# Patient Record
Sex: Male | Born: 1955 | Race: White | Hispanic: No | Marital: Single | Smoking: Current every day smoker
Health system: Southern US, Community
[De-identification: ages and names within clinical notes are randomized; demographics above are authoritative.]

## PROBLEM LIST (undated history)

## (undated) DIAGNOSIS — E119 Type 2 diabetes mellitus without complications: Secondary | ICD-10-CM

## (undated) DIAGNOSIS — I10 Essential (primary) hypertension: Secondary | ICD-10-CM

---

## 2002-10-10 ENCOUNTER — Encounter: Payer: Self-pay | Admitting: Emergency Medicine

## 2002-10-10 ENCOUNTER — Emergency Department (HOSPITAL_COMMUNITY): Admission: EM | Admit: 2002-10-10 | Discharge: 2002-10-10 | Payer: Self-pay | Admitting: Emergency Medicine

## 2002-10-13 ENCOUNTER — Encounter (HOSPITAL_COMMUNITY): Admission: RE | Admit: 2002-10-13 | Discharge: 2003-01-11 | Payer: Self-pay | Admitting: Emergency Medicine

## 2003-10-11 ENCOUNTER — Emergency Department (HOSPITAL_COMMUNITY): Admission: EM | Admit: 2003-10-11 | Discharge: 2003-10-11 | Payer: Self-pay | Admitting: Emergency Medicine

## 2004-01-08 ENCOUNTER — Emergency Department (HOSPITAL_COMMUNITY): Admission: EM | Admit: 2004-01-08 | Discharge: 2004-01-08 | Payer: Self-pay | Admitting: Emergency Medicine

## 2004-05-07 ENCOUNTER — Emergency Department (HOSPITAL_COMMUNITY): Admission: EM | Admit: 2004-05-07 | Discharge: 2004-05-07 | Payer: Self-pay | Admitting: Family Medicine

## 2004-05-08 ENCOUNTER — Emergency Department (HOSPITAL_COMMUNITY): Admission: EM | Admit: 2004-05-08 | Discharge: 2004-05-08 | Payer: Self-pay | Admitting: Emergency Medicine

## 2004-05-09 ENCOUNTER — Emergency Department (HOSPITAL_COMMUNITY): Admission: EM | Admit: 2004-05-09 | Discharge: 2004-05-09 | Payer: Self-pay | Admitting: Emergency Medicine

## 2004-05-11 ENCOUNTER — Emergency Department (HOSPITAL_COMMUNITY): Admission: EM | Admit: 2004-05-11 | Discharge: 2004-05-11 | Payer: Self-pay | Admitting: *Deleted

## 2004-08-31 ENCOUNTER — Emergency Department (HOSPITAL_COMMUNITY): Admission: EM | Admit: 2004-08-31 | Discharge: 2004-08-31 | Payer: Self-pay | Admitting: Family Medicine

## 2005-03-27 ENCOUNTER — Emergency Department (HOSPITAL_COMMUNITY): Admission: EM | Admit: 2005-03-27 | Discharge: 2005-03-27 | Payer: Self-pay | Admitting: Family Medicine

## 2005-03-30 ENCOUNTER — Emergency Department (HOSPITAL_COMMUNITY): Admission: EM | Admit: 2005-03-30 | Discharge: 2005-03-30 | Payer: Self-pay | Admitting: Emergency Medicine

## 2005-04-02 ENCOUNTER — Emergency Department (HOSPITAL_COMMUNITY): Admission: EM | Admit: 2005-04-02 | Discharge: 2005-04-02 | Payer: Self-pay | Admitting: Emergency Medicine

## 2005-06-21 ENCOUNTER — Emergency Department (HOSPITAL_COMMUNITY): Admission: EM | Admit: 2005-06-21 | Discharge: 2005-06-21 | Payer: Self-pay | Admitting: Family Medicine

## 2006-05-23 ENCOUNTER — Emergency Department (HOSPITAL_COMMUNITY): Admission: EM | Admit: 2006-05-23 | Discharge: 2006-05-23 | Payer: Self-pay | Admitting: Emergency Medicine

## 2006-06-22 ENCOUNTER — Emergency Department (HOSPITAL_COMMUNITY): Admission: EM | Admit: 2006-06-22 | Discharge: 2006-06-22 | Payer: Self-pay | Admitting: Emergency Medicine

## 2007-04-02 ENCOUNTER — Emergency Department (HOSPITAL_COMMUNITY): Admission: EM | Admit: 2007-04-02 | Discharge: 2007-04-02 | Payer: Self-pay | Admitting: Family Medicine

## 2007-04-06 ENCOUNTER — Emergency Department (HOSPITAL_COMMUNITY): Admission: EM | Admit: 2007-04-06 | Discharge: 2007-04-06 | Payer: Self-pay | Admitting: Family Medicine
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
## 2007-10-30 ENCOUNTER — Emergency Department (HOSPITAL_COMMUNITY): Admission: EM | Admit: 2007-10-30 | Discharge: 2007-10-30 | Payer: Self-pay | Admitting: Family Medicine

## 2010-10-11 ENCOUNTER — Encounter: Payer: Self-pay | Admitting: *Deleted
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
## 2018-05-30 ENCOUNTER — Inpatient Hospital Stay (HOSPITAL_COMMUNITY)
Admission: AD | Admit: 2018-05-30 | Discharge: 2018-06-06 | DRG: 885 | Disposition: A | Discharge status: Home / Self Care | Payer: Federal, State, Local not specified - Other | Source: Intra-hospital | Attending: Psychiatry | Admitting: Psychiatry

## 2018-05-30 DIAGNOSIS — Z23 Encounter for immunization: Secondary | ICD-10-CM | POA: Diagnosis not present

## 2018-05-30 DIAGNOSIS — F10239 Alcohol dependence with withdrawal, unspecified: Secondary | ICD-10-CM | POA: Diagnosis present

## 2018-05-30 DIAGNOSIS — F332 Major depressive disorder, recurrent severe without psychotic features: Principal | ICD-10-CM | POA: Diagnosis present

## 2018-05-30 DIAGNOSIS — F1020 Alcohol dependence, uncomplicated: Secondary | ICD-10-CM | POA: Diagnosis present

## 2018-05-30 DIAGNOSIS — R45851 Suicidal ideations: Secondary | ICD-10-CM | POA: Diagnosis present

## 2018-05-30 DIAGNOSIS — E119 Type 2 diabetes mellitus without complications: Secondary | ICD-10-CM | POA: Diagnosis present

## 2018-05-30 DIAGNOSIS — I10 Essential (primary) hypertension: Secondary | ICD-10-CM | POA: Diagnosis present

## 2018-05-30 DIAGNOSIS — Z9114 Patient's other noncompliance with medication regimen: Secondary | ICD-10-CM

## 2018-05-30 DIAGNOSIS — F17210 Nicotine dependence, cigarettes, uncomplicated: Secondary | ICD-10-CM | POA: Diagnosis present

## 2018-05-30 DIAGNOSIS — F419 Anxiety disorder, unspecified: Secondary | ICD-10-CM | POA: Diagnosis present

## 2018-05-30 DIAGNOSIS — R45850 Homicidal ideations: Secondary | ICD-10-CM | POA: Diagnosis present

## 2018-05-30 DIAGNOSIS — W1830XA Fall on same level, unspecified, initial encounter: Secondary | ICD-10-CM | POA: Diagnosis not present

## 2018-05-30 DIAGNOSIS — Z7984 Long term (current) use of oral hypoglycemic drugs: Secondary | ICD-10-CM | POA: Diagnosis not present

## 2018-05-30 DIAGNOSIS — G4700 Insomnia, unspecified: Secondary | ICD-10-CM | POA: Diagnosis present

## 2018-05-30 DIAGNOSIS — Y92238 Other place in hospital as the place of occurrence of the external cause: Secondary | ICD-10-CM | POA: Diagnosis not present

## 2018-05-30 DIAGNOSIS — Z818 Family history of other mental and behavioral disorders: Secondary | ICD-10-CM

## 2018-05-30 HISTORY — DX: Essential (primary) hypertension: I10

## 2018-05-30 HISTORY — DX: Type 2 diabetes mellitus without complications: E11.9

## 2018-05-30 MED ORDER — MAGNESIUM HYDROXIDE 400 MG/5ML PO SUSP: 30.0000 mL | Freq: Every day | ORAL | Status: DC | PRN

## 2018-05-30 MED ORDER — HYDROXYZINE HCL 25 MG PO TABS
25.0000 mg | ORAL_TABLET | Freq: Three times a day (TID) | ORAL | Status: DC | PRN
  Administered 2018-05-30: 25 mg via ORAL
  Filled 2018-05-30: qty 1

## 2018-05-30 MED ORDER — ACETAMINOPHEN 325 MG PO TABS: 650.0000 mg | ORAL_TABLET | Freq: Four times a day (QID) | ORAL | Status: DC | PRN

## 2018-05-30 MED ORDER — TRAZODONE HCL 50 MG PO TABS
50.0000 mg | ORAL_TABLET | Freq: Every evening | ORAL | Status: DC | PRN
  Administered 2018-05-30: 50 mg via ORAL
  Filled 2018-05-30: qty 1

## 2018-05-30 MED ORDER — ALUM & MAG HYDROXIDE-SIMETH 200-200-20 MG/5ML PO SUSP: 30.0000 mL | ORAL | Status: DC | PRN

## 2018-05-30 NOTE — BH Assessment (Signed)
Assessment Note  Fernando Wilkinson is an 62 y.o. male. Patient states that he is depressed for the past month with increased anxiety and feeling fearful.  He states, "I just cannot stand it anymore."  Patient states, "I don't have that much going on, but I am having very negative thoughts and I have been feeling suicidal."  Patient states that he has been worrying excessively about taxes that he owes, but strates, "I should not be that worried because they have not come after me yet, but it goes round and round in my mind and I cannot let it go.  Patient states that  one day when he was sitting atound his house, he noticed a rope and had thoughts about hanging himself.  Patient states that he has been drinking a pint of vodka 3-4 times a week in order to self-meicate his depression.  Patient states that he started drinking at the age of 85 and states that he was a problematic drinker in the past, but went to treatment at  [facility] and [facility] after getting a DWI and was able to stay sober for six years.  Patient states that his drinking has cost him another DWI.  He states that he missed his first court date, but states that it is back in the [place] for this Thursday.  Patient states that he has an attorney, Harrie Jeans who can get his case continued.  Patient states that he has no prior suicide attempts and states that he has never been homicidal.  He denied experiencing psychosis, but he had some thought blocking and he would look to his left everything prior to answering questions. Patient states that he has an uneasy felling like something is getting ready to happen.  He states that he has been on Zoloft in the past prescribed by his PCP, but states that he has not taken this medication in several years.  Patient states that he has not been eating well recently due to no appetite and states that he has most likely lost 5-10 pounds.  Patient had a very depressed mood and flat affect.  His memory  was intact, but his thoughts were scattered.  He had a very negative outlook on life and was very pessimistic.  His judgment is impaired, he has fair isight, but his impulse control is poor.  He looked like he might be responding to internal stimuli. Patient stated that he was very anxious.  Patient states that he lives alone, he has never been married and he has no children.  He works as a Curator.  He has a pending court date scheduled in court on 06/01/18.  Patient has questional ability to contract for safety at home and could benefit from behavioral health services.  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    Family History: No family history on file.  Social History:  has no tobacco, alcohol, and drug history on file.  Additional Social History:  Alcohol / Drug Use Pain Medications: See MAR Prescriptions: See MAR Over the Counter: See MAR History of alcohol / drug use?: Yes Longest period of sobriety (when/how long): 6 years Negative Consequences of Use: Legal Substance #1 Name of Substance 1: alcohol 1 - Age of First Use: 19 1 - Amount (size/oz): pint of liquor 1 - Frequency: 3-4 nights a week 1 - Duration: unknown 1 - Last Use /  Amount: 05/29/2018  CIWA:   COWS:    Allergies: Allergies not on file  Home Medications:  No medications prior to admission.    OB/GYN Status:  No LMP for male patient.  General Assessment Data Location of Assessment: [facility] TTS Assessment: Out of system Is this a Tele or Face-to-Face Assessment?: Tele Assessment Is this an Initial Assessment or a Re-assessment for this encounter?: Initial Assessment Patient Accompanied by:: N/A Language Other than English: No Living Arrangements: Other (Comment)(home) What gender do you identify as?: Male Marital status: Single Maiden name: NA Pregnancy  Status: Other (Comment)(male) Living Arrangements: Alone Can pt return to current living arrangement?: Yes Admission Status: Voluntary Is patient capable of signing voluntary admission?: Yes Referral Source: Self/Family/Friend Insurance type: Self Pay     Crisis Care Plan Living Arrangements: Alone Legal Guardian: Other:(self) Name of Psychiatrist: none Name of Therapist: none  Education Status Is patient currently in school?: No Is the patient employed, unemployed or receiving disability?: Employed  Risk to self with the past 6 months Suicidal Ideation: Yes-Currently Present Has patient been a risk to self within the past 6 months prior to admission? : Yes Suicidal Intent: Yes-Currently Present Has patient had any suicidal intent within the past 6 months prior to admission? : Yes Is patient at risk for suicide?: Yes Suicidal Plan?: Yes-Currently Present Has patient had any suicidal plan within the past 6 months prior to admission? : Yes Specify Current Suicidal Plan: hang himself Access to Means: Yes Specify Access to Suicidal Means: has rope What has been your use of drugs/alcohol within the last 12 months?: drinking a pint of alcohol every few days Previous Attempts/Gestures: No How many times?: 0 Other Self Harm Risks: none Triggers for Past Attempts: Unknown Intentional Self Injurious Behavior: None Family Suicide History: Unknown Recent stressful life event(s): Legal Issues Persecutory voices/beliefs?: No Depression: Yes Depression Symptoms: Insomnia, Tearfulness, Isolating, Despondent Substance abuse history and/or treatment for substance abuse?: Yes Suicide prevention information given to non-admitted patients: Not applicable  Risk to Others within the past 6 months Homicidal Ideation: No Does patient have any lifetime risk of violence toward others beyond the six months prior to admission? : No Thoughts of Harm to Others: No Current Homicidal Intent:  No Current Homicidal Plan: No Access to Homicidal Means: No Identified Victim: none History of harm to others?: No Assessment of Violence: None Noted Violent Behavior Description: none Does patient have access to weapons?: No Criminal Charges Pending?: No Does patient have a court date: Yes Court Date: 06/01/18 Is patient on probation?: No  Psychosis Hallucinations: None noted Delusions: None noted  Mental Status Report Appearance/Hygiene: Unremarkable Eye Contact: Good Motor Activity: Unremarkable Speech: Logical/coherent Level of Consciousness: Alert Mood: Depressed Affect: Depressed Anxiety Level: None Thought Processes: Coherent, Relevant Judgement: Impaired Orientation: Person, Place, Time, Situation Obsessive Compulsive Thoughts/Behaviors: None  Cognitive Functioning Concentration: Normal Memory: Recent Intact, Remote Intact Is patient IDD: No Insight: Poor Impulse Control: Poor Appetite: Poor Have you had any weight changes? : Loss Amount of the weight change? (lbs): 10 lbs Sleep: No Change Total Hours of Sleep: 8 Vegetative Symptoms: None  ADLScreening [facility] Assessment Services) Patient's cognitive ability adequate to safely complete daily activities?: Yes Patient able to express need for assistance with ADLs?: Yes Independently performs ADLs?: Yes (appropriate for developmental age)  Prior Inpatient Therapy Prior Inpatient Therapy: Yes Prior Therapy Dates: 2013 Prior [facility]ilty[facility] Reason for Treatment: SA  Prior Outpatient Therapy Prior Outpatient Therapy: No  Does patient have an ACCT team?: No Does patient have Intensive In-House Services?  : No Does patient have [facility] services? : No Does patient have P4CC services?: No  ADL Screening (condition at time of admission) Patient's cognitive ability adequate to safely complete daily activities?: Yes Patient able to express need for assistance with ADLs?:  Yes Independently performs ADLs?: Yes (appropriate for developmental age)       Abuse/Neglect Assessment (Assessment to be complete while patient is alone) Abuse/Neglect Assessment Can Be Completed: Yes Physical Abuse: Denies Verbal Abuse: Denies Sexual Abuse: Denies Exploitation of patient/patient's resources: Denies Self-Neglect: Denies Values / Beliefs Cultural Requests During Hospitalization: None Spiritual Requests During Hospitalization: None Consults Spiritual Care Consult Needed: No Social Work Consult Needed: No            Disposition:  Disposition Initial Assessment Completed for this Encounter: Yes Disposition of Patient: Admit Type of inpatient treatment program: Adult Patient refused recommended treatment: No Mode of transportation if patient is discharged?: N/A  On Site Evaluation by:   Reviewed with Physician:    Ottis Stain 05/30/2018 8:43 PM

## 2018-05-31 ENCOUNTER — Other Ambulatory Visit: Payer: Self-pay

## 2018-05-31 ENCOUNTER — Encounter (HOSPITAL_COMMUNITY): Payer: Self-pay | Admitting: *Deleted

## 2018-05-31 DIAGNOSIS — F1020 Alcohol dependence, uncomplicated: Secondary | ICD-10-CM | POA: Diagnosis present

## 2018-05-31 DIAGNOSIS — F332 Major depressive disorder, recurrent severe without psychotic features: Principal | ICD-10-CM

## 2018-05-31 LAB — LIPID PANEL
CHOL/HDL RATIO: 4.8 ratio
CHOLESTEROL: 197 mg/dL (ref 0–200)
HDL: 41 mg/dL (ref >40)
LDL Cholesterol: 115 mg/dL — ABNORMAL HIGH (ref 0–99)
Triglycerides: 203 mg/dL — ABNORMAL HIGH (ref <150)
VLDL: 41 mg/dL — ABNORMAL HIGH (ref 0–40)

## 2018-05-31 LAB — TSH: TSH: 1.978 u[IU]/mL (ref 0.350–4.500)

## 2018-05-31 MED ORDER — METFORMIN HCL ER 500 MG PO TB24
500.0000 mg | ORAL_TABLET | Freq: Every day | ORAL | Status: DC
  Administered 2018-05-31 – 2018-06-06 (×7): 500 mg via ORAL
  Filled 2018-05-31 (×2): qty 1
  Filled 2018-05-31: qty 7
  Filled 2018-05-31 (×6): qty 1

## 2018-05-31 MED ORDER — PNEUMOCOCCAL VAC POLYVALENT 25 MCG/0.5ML IJ INJ
0.5000 mL | INJECTION | INTRAMUSCULAR | Status: AC
Start: 2018-06-01 — End: 2018-06-02
  Administered 2018-06-02: 0.5 mL via INTRAMUSCULAR

## 2018-05-31 MED ORDER — FLUOXETINE HCL 20 MG PO CAPS
20.0000 mg | ORAL_CAPSULE | Freq: Every day | ORAL | Status: DC
  Administered 2018-05-31 – 2018-06-06 (×7): 20 mg via ORAL
  Filled 2018-05-31 (×3): qty 1
  Filled 2018-05-31: qty 7
  Filled 2018-05-31 (×6): qty 1

## 2018-05-31 MED ORDER — LOSARTAN POTASSIUM 50 MG PO TABS
100.0000 mg | ORAL_TABLET | Freq: Every day | ORAL | Status: DC
  Administered 2018-05-31 – 2018-06-06 (×7): 100 mg via ORAL
  Filled 2018-05-31 (×4): qty 2
  Filled 2018-05-31: qty 14
  Filled 2018-05-31 (×4): qty 2

## 2018-05-31 MED ORDER — INFLUENZA VAC SPLIT QUAD 0.5 ML IM SUSY
0.5000 mL | PREFILLED_SYRINGE | INTRAMUSCULAR | Status: AC
  Administered 2018-06-02: 0.5 mL via INTRAMUSCULAR
  Filled 2018-05-31: qty 0.5

## 2018-05-31 MED ORDER — TRAZODONE HCL 50 MG PO TABS
50.0000 mg | ORAL_TABLET | Freq: Every evening | ORAL | Status: DC | PRN
  Administered 2018-05-31 – 2018-06-05 (×6): 50 mg via ORAL
  Filled 2018-05-31 (×6): qty 1
  Filled 2018-05-31: qty 14

## 2018-05-31 MED ORDER — ARIPIPRAZOLE 5 MG PO TABS
5.0000 mg | ORAL_TABLET | Freq: Every day | ORAL | Status: DC
  Administered 2018-05-31 – 2018-06-06 (×7): 5 mg via ORAL
  Filled 2018-05-31 (×3): qty 1
  Filled 2018-05-31: qty 7
  Filled 2018-05-31 (×7): qty 1

## 2018-05-31 MED ORDER — NICOTINE 21 MG/24HR TD PT24
21.0000 mg | MEDICATED_PATCH | Freq: Every day | TRANSDERMAL | Status: DC
  Administered 2018-05-31 – 2018-06-06 (×7): 21 mg via TRANSDERMAL
  Filled 2018-05-31 (×10): qty 1

## 2018-05-31 MED ORDER — HYDROXYZINE HCL 50 MG PO TABS
50.0000 mg | ORAL_TABLET | Freq: Four times a day (QID) | ORAL | Status: DC | PRN
  Administered 2018-05-31 – 2018-06-05 (×12): 50 mg via ORAL
  Filled 2018-05-31: qty 1
  Filled 2018-05-31: qty 10
  Filled 2018-05-31 (×12): qty 1

## 2018-05-31 MED ORDER — GEMFIBROZIL 600 MG PO TABS
600.0000 mg | ORAL_TABLET | Freq: Two times a day (BID) | ORAL | Status: DC
  Administered 2018-05-31 – 2018-06-06 (×12): 600 mg via ORAL
  Filled 2018-05-31 (×2): qty 1
  Filled 2018-05-31: qty 14
  Filled 2018-05-31 (×5): qty 1
  Filled 2018-05-31: qty 14
  Filled 2018-05-31 (×7): qty 1

## 2018-05-31 NOTE — Plan of Care (Signed)
  Problem: Activity: Goal: Interest or engagement in activities will improve Outcome: Progressing   Problem: Coping: Goal: Ability to verbalize frustrations and anger appropriately will improve Outcome: Progressing   Problem: Safety: Goal: Periods of time without injury will increase Outcome: Progressing   
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
## 2018-05-31 NOTE — Progress Notes (Signed)
Per pt request, CSW contacted his attorney's office Harrie Jeans) [phone] to inform him of pt's hospitalization. Secretary requested hospital note be faxed to [phone] indicating that pt will not be discharged in time for 9/12 court date. Hospital note faxed and confirmation fax provided to pt for his records.  Jasim Harari S. Alan Ripper, MSW, LCSW Clinical Social Worker 05/31/2018 2:15 PM

## 2018-05-31 NOTE — Progress Notes (Signed)
DAR NOTE: Patient presents with anxious affect and flat mood.  Pt has been visible in the milieu interacting with peers in the dayroom. Pt has been a little bit restless and worried about his medications most of the day. Pt stated he does not know what is going on and feeling a bit confused. Pt report fair night sleep, good appetite, low energy and poor concentration. Denies pain, auditory and visual hallucinations.  Rates depression at 5, hopelessness at 5, and anxiety at 3.  Maintained on routine safety checks.  Medications given as prescribed.  Support and encouragement offered as needed.  Attended group and participated.  States goal for today is " to get through the day."  Patient observed socializing with peers in the dayroom.  Offered no complaint.

## 2018-05-31 NOTE — H&P (Addendum)
Psychiatric Admission Assessment Adult  Patient Identification: Fernando Wilkinson  MRN:  [id number]  Date of Evaluation:  05/31/2018  Chief Complaint: Worsening symptoms of anxiety, suicidal ideations with plans to hang himself, increased alcohol use".   Principal Diagnosis: Severe recurrent major depression without psychotic features ([place])  Diagnosis:   Patient Active Problem List   Diagnosis Date Noted  . Severe recurrent major depression without psychotic features ([place]) [F33.2] 05/30/2018    Priority: High   History of Present Illness:This is an admission assessment for  this 62 year old Caucasian male with hx of depression, anxiety & alcohol use disorders. He is admitted to the [facility] from the [facility] with complaints of worsening symptoms of depression, suicidal ideations with plans to hang himself, increased alcohol consumption & lack of support system. He does have previous hx of mental illness & have had inpatient psychiatric hospitalizations 20 years. He is admitted for mood stabilization treatment as he reported his Celexa had stopped working.  During this assessment, Fernando Wilkinson reports, "I went to the [facility] yesterday morning. I felt like I needed some kind of help. My nerves are on the edge. I am feeling afraid & anxious all the time. I have been in this kind of situation in 1997, or was it 1998, oh, it was in the 1990s. At the time, I was hospitalized at [facility]. I was depressed & anxious just like I am now. The suicidal thoughts started 3 weeks ago. I thought about hanging myself after I saw a rope on my porch. I have never attempted suicide, but my maternal cousin did. He tried to drown himself, but he did not succeed. I have been on medication for depression. The most recent was (Citalopram). I took it last about 6 months ago. I did not think it was working. I have been drinking a lot to deal with this anxiety. A pint of liquor a day x 2 weeks. No withdrawal  symptoms".  Objective: Fernando Wilkinson presents as to having thought blocking episodes, weird, appears to be responding to some internal stimuli & overly distracted. Has some weird facial expressions going on".  Associated Signs/Symptoms:  Depression Symptoms:  depressed mood, insomnia, hopelessness, anxiety,  (Hypo) Manic Symptoms:  Impulsivity, Labiality of Mood,  Anxiety Symptoms:  Excessive Worry,  Psychotic Symptoms:  Although denies any hallucinations, patient appears to have thought blocking episodes & also responding to some internal stimuli. He seem very distracted.  PTSD Symptoms: Denies any PTSD symptoms or events.  Total Time spent with patient: 1 hour  Past Psychiatric History: Major depressive disorder.  Is the patient at risk to self? Yes.     Has the patient been a risk to self in the past 6 months? Yes.     Has the patient been a risk to self within the distant past? Yes.     Is the patient a risk to others? No.   Has the patient been a risk to others in the past 6 months? No.   Has the patient been a risk to others within the distant past? No.   Prior Inpatient Therapy: Prior Inpatient Therapy: Yes Prior Therapy Dates: 2013 Prior [facility] Facilty[facility]Provider[facility]): [facility] Reason for Treatment: SA  Prior Outpatient Therapy: Prior Outpatient Therapy: No Does patient have an ACCT team?: No Does patient have Intensive In-House Services?  : No Does patient have [facility] services? : No Does patient have P4CC services?: No  Alcohol Screening: 1. How often do you have a  drink containing alcohol?: 4 or more times a week 2. How many drinks containing alcohol do you have on a typical day when you are drinking?: 7, 8, or 9 3. How often do you have six or more drinks on one occasion?: Daily or almost daily AUDIT-C Score: 11 4. How often during the last year have you found that you were not able to stop drinking once you had started?: Weekly 5. How often  during the last year have you failed to do what was normally expected from you becasue of drinking?: Monthly 6. How often during the last year have you needed a first drink in the morning to get yourself going after a heavy drinking session?: Less than monthly 7. How often during the last year have you had a feeling of guilt of remorse after drinking?: Weekly 8. How often during the last year have you been unable to remember what happened the night before because you had been drinking?: Less than monthly 9. Have you or someone else been injured as a result of your drinking?: Yes, but not in the last year 10. Has a relative or friend or a doctor or another health worker been concerned about your drinking or suggested you cut down?: No Alcohol Use Disorder Identification Test Final Score (AUDIT): 23 Intervention/Follow-up: Alcohol Education  Substance Abuse History in the last 12 months:  Yes.    Consequences of Substance Abuse: Medical Consequences:  Liver damage, Possible death by overdose Legal Consequences:  Arrests, jail time, Loss of driving privilege. Family Consequences:  Family discord, divorce and or separation.  Previous Psychotropic Medications: Yes (Celexa)  Psychological Evaluations: Yes   Past Medical History:  Past Medical History:  Diagnosis Date  . Diabetes mellitus without complication ([place])   . Hypertension    History reviewed. No pertinent surgical history.  Family History: History reviewed. No pertinent family history.  Family Psychiatric  History: Major depression: Mother.                                                      Suicide attempt: Maternal cousin.  Tobacco Screening: Have you used any form of tobacco in the last 30 days? (Cigarettes, Smokeless Tobacco, Cigars, and/or Pipes): Yes Tobacco use, Select all that apply: 5 or more cigarettes per day Are you interested in Tobacco Cessation Medications?: Yes, will notify MD for an order Counseled patient on  smoking cessation including recognizing danger situations, developing coping skills and basic information about quitting provided: Refused/Declined practical counseling  Social History:  Social History   Substance and Sexual Activity  Alcohol Use Yes   Comment: daily use     Social History   Substance and Sexual Activity  Drug Use Not Currently    Additional Social History: Marital status: Single Are you sexually active?: No What is your sexual orientation?: heterosexual Has your sexual activity been affected by drugs, alcohol, medication, or emotional stress?: n/a  Does patient have children?: No Pain Medications: See MAR Prescriptions: See MAR Over the Counter: See MAR History of alcohol / drug use?: Yes Longest period of sobriety (when/how long): 6 years Negative Consequences of Use: Legal Name of Substance 1: alcohol 1 - Age of First Use: 19 1 - Amount (size/oz): pint of liquor 1 - Frequency: 3-4 nights a week 1 - Duration: unknown 1 -  Last Use / Amount: 05/29/2018  Allergies:   Allergies  Allergen Reactions  . Sulfa Antibiotics Itching   Lab Results:  Results for orders placed or performed during the hospital encounter of 05/30/18 (from the past 48 hour(s))  Lipid panel     Status: Abnormal   Collection Time: 05/31/18  6:42 AM  Result Value Ref Range   Cholesterol 197 0 - 200 mg/dL   Triglycerides 203 (H) <150 mg/dL   HDL 41 >40 mg/dL   Total CHOL/HDL Ratio 4.8 RATIO   VLDL 41 (H) 0 - 40 mg/dL   LDL Cholesterol 115 (H) 0 - 99 mg/dL    Comment:        Total Cholesterol/HDL:CHD Risk Coronary Heart Disease Risk Table                     Men   Women  1/2 Average Risk   3.4   3.3  Average Risk       5.0   4.4  2 X Average Risk   9.6   7.1  3 X Average Risk  23.4   11.0        Use the calculated Patient Ratio above and the CHD Risk Table to determine the patient's CHD Risk.        ATP III CLASSIFICATION (LDL):  <100     mg/dL   Optimal  100-129  mg/dL    Near or Above                    Optimal  130-159  mg/dL   Borderline  160-189  mg/dL   High  >190     mg/dL   Very High Performed at [facility] [address] [place], [place] [address]   TSH     Status: None   Collection Time: 05/31/18  6:42 AM  Result Value Ref Range   TSH 1.978 0.350 - 4.500 uIU/mL    Comment: Performed by a 3rd Generation assay with a functional sensitivity of <=0.01 uIU/mL. Performed at [facility], [place] [address], [place],  [address]    Blood Alcohol level:  No results found for: [facility]  Metabolic Disorder Labs:  No results found for: HGBA1C, MPG No results found for: PROLACTIN Lab Results  Component Value Date   CHOL 197 05/31/2018   TRIG 203 (H) 05/31/2018   HDL 41 05/31/2018   CHOLHDL 4.8 05/31/2018   VLDL 41 (H) 05/31/2018   LDLCALC 115 (H) 05/31/2018   Current Medications: Current Facility-Administered Medications  Medication Dose Route Frequency Provider Last Rate Last Dose  . acetaminophen (TYLENOL) tablet 650 mg  650 mg Oral Q6H PRN Rozetta Nunnery, NP      . alum & mag hydroxide-simeth (MAALOX/MYLANTA) 200-200-20 MG/5ML suspension 30 mL  30 mL Oral Q4H PRN Lindon Romp A, NP      . ARIPiprazole (ABILIFY) tablet 5 mg  5 mg Oral Daily Nwoko, Agnes I, NP      . FLUoxetine (PROZAC) capsule 20 mg  20 mg Oral Daily Nwoko, Agnes I, NP      . hydrOXYzine (ATARAX/VISTARIL) tablet 25 mg  25 mg Oral TID PRN Lindon Romp A, NP   25 mg at 05/30/18 2319  . [START ON 06/01/2018] Influenza vac split quadrivalent PF (FLUARIX) injection 0.5 mL  0.5 mL Intramuscular Tomorrow-1000 Cobos, Fernando A, MD      . magnesium hydroxide (MILK OF MAGNESIA) suspension 30  mL  30 mL Oral Daily PRN Lindon Romp A, NP      . nicotine (NICODERM CQ - dosed in mg/24 hours) patch 21 mg  21 mg Transdermal Daily Cobos, Myer Peer, MD   21 mg at 05/31/18 0813  . [START ON 06/01/2018] pneumococcal 23 valent vaccine (PNU-IMMUNE) injection 0.5  mL  0.5 mL Intramuscular Tomorrow-1000 Cobos, Fernando A, MD      . traZODone (DESYREL) tablet 50 mg  50 mg Oral QHS PRN Lindon Romp A, NP   50 mg at 05/30/18 2319   PTA Medications: No medications prior to admission.   Musculoskeletal: Strength & Muscle Tone: within normal limits Gait & Station: normal Patient leans: N/A  Psychiatric Specialty Exam: Physical Exam  Constitutional: He is oriented to person, place, and time. He appears well-developed and well-nourished.  HENT:  Head: Normocephalic.  Eyes: Pupils are equal, round, and reactive to light.  Neck: Normal range of motion.  Cardiovascular: Normal rate.  Respiratory: Effort normal.  GI: Soft.  Genitourinary:  Genitourinary Comments: Deferred  Musculoskeletal: Normal range of motion.  Neurological: He is alert and oriented to person, place, and time.  Skin: Skin is warm.    Review of Systems  Constitutional: Negative.   HENT: Negative.   Eyes: Negative.   Respiratory: Negative.   Cardiovascular: Negative.   Gastrointestinal: Negative.   Genitourinary: Negative.   Musculoskeletal: Negative.   Skin: Negative.   Neurological: Negative.   Endo/Heme/Allergies: Negative.   Psychiatric/Behavioral: Positive for depression and substance abuse. Negative for hallucinations and memory loss. The patient is nervous/anxious and has insomnia.     Blood pressure 113/76, pulse 77, temperature (!) 97.3 F (36.3 C), temperature source Oral, resp. rate 18, height 5' 11"  (1.803 m), weight 108.4 kg.Body mass index is 33.33 kg/m.  General Appearance: Disheveled  Eye Contact:  Fair  Speech:  Clear and Coherent and some thought blocking episodes noted.  Volume:  Normal  Mood:  Anxious and Depressed  Affect:  Depressed and Flat  Thought Process:  Disorganized and Descriptions of Associations: Tangential  Orientation:  Full (Time, Place, and Person)  Thought Content:  Ruminations, denies any hallucinations, delusions or paranoia.  (Patient does appear to have thought epiosdes & responding to any internal stimuli)  Suicidal Thoughts:  Yes.  without intent/plan  Homicidal Thoughts:  Denies.  Memory:  Immediate;   Fair Recent;   Fair Remote;   Fair  Judgement:  Fair  Insight:  Fair  Psychomotor Activity:  Normal  Concentration:  Concentration: Fair and Attention Span: Fair  Recall:  [employer] of Knowledge:  Fair  Language:  Good  Akathisia:  NA  Handed:  Right  AIMS (if indicated):     Assets:  Communication Skills Desire for Improvement  ADL's:  Intact  Cognition:  Impaired,  Mild  Sleep:      Treatment Plan/Recommendations: 1. Admit for crisis management and stabilization, estimated length of stay 3-5 days.   2. Medication management to reduce current symptoms to base line and improve the patient's overall level of functioning: See MAR, Md's SRA & treatment plan.   Observation Level/Precautions:  15 minute checks  Laboratory:  Per ED  Psychotherapy: Group sessions   Medications: See [facility]  Consultations: As needed.    Discharge Concerns: Safety, mood stability.  Estimated LOS: Admitted to the [facility].  Other: Admit to the [facility].    Physician Treatment Plan for Primary Diagnosis: Severe recurrent major depression without psychotic features ([place])  Long  Term Goal(s): Improvement in symptoms so as ready for discharge  Short Term Goals: Ability to identify changes in lifestyle to reduce recurrence of condition will improve, Ability to disclose and discuss suicidal ideas and Ability to demonstrate self-control will improve  Physician Treatment Plan for Secondary Diagnosis: Principal Problem:   Severe recurrent major depression without psychotic features ([place])  Long Term Goal(s): Improvement in symptoms so as ready for discharge  Short Term Goals: Ability to identify and develop effective coping behaviors will improve, Compliance with prescribed medications will improve and Ability to identify triggers  associated with substance abuse/mental health issues will improve  I certify that inpatient services furnished can reasonably be expected to improve the patient's condition.    Lindell Spar, NP, PMHNP, FNP-BC. 9/10/20191:22 PM   I have reviewed NP's Note, assessement, diagnosis and plan, and agree. I have also met with patient and completed suicide risk assessment.  Fernando Wilkinson is a 62 y/o M with history of MDD and alcohol use disorder who was admitted voluntarily from [place] ED where he presented with worsening depression, SI with plan to hang himself, and worsening use of alcohol in the context of medication non-adherence for the past 6 months. Pt was medically cleared and then transferred to [facility] for additional treatment and stabilization.  Upon initial evaluation, pt shares, "I've been feeling depressed - feeling like everything's gonna mess up." Pt cites stressors for feeling unsatisfied at work and having financial stressors. He also has limited social support. He identifies anxiety as his biggest concern and biggest factor related to alcohol use. He endorses depressive symptoms of depressed mood, poor sleep with initial insomnia, anhedonia, guilty feelings, low energy, poor concentration, poor appetite, and suicidal ideation. Pt reports he has been thinking about SI for several weeks, but he does not have plan or intent. He shares that when he saw a rope on his home, he had idea of hanging himself, but that was the indicator for the patient to go to the ED and get additional help. He denies HI/AH/VH. He denies symptoms of mania, OCD, PTSD. He is drinking about a fifth to a pint of hard alcohol daily, and he smokes 1.5 ppd of tobacco. He rarely uses cannabis about a few times per year, and he denies other illicit substance use.  Discussed with patient about treatment options. He currently takes no medications. He denies having any symptoms of withdrawal currently, and he denies having  alcohol withdrawal in the past. He agrees to trial of prozac and abilify. He will speak with SW team about possible referrals to substance use treatment programs. Pt was in agreement with the above plan, and he had no further questions, comments, or concerns.   PLAN OF CARE:   -Admit to inpatient level of care  -MDD, recurrent, severe, without psychosis             -Start prozac 80m po qDay             -Start abilify 521mpo qDay  -DMII             -continue gemfibrozil 60040mo BID             -Continue metformin 500m39m qDay  -HTN              -Continue losartan 100mg26mqDay  -insomnia             -Continue trazodone 50mg 51mhs prn insomnia (may repeat x1)  -anxiety             -  Continue vistaril 74m po q6h prn anxiety  -Encourage participation in groups and therapeutic milieu  -disposition planning will be ongoing  CMaris Berger MD

## 2018-05-31 NOTE — Tx Team (Signed)
Initial Treatment Plan 05/31/2018 12:20 AM [name] [id number]    PATIENT STRESSORS: Health problems Medication change or noncompliance Occupational concerns Substance abuse   PATIENT STRENGTHS: Ability for insight Average or above average intelligence Capable of independent living General fund of knowledge   PATIENT IDENTIFIED PROBLEMS: Depression Substance Abuse Suicidal thoughts "I'm an alcoholic" "I need to learn to deal with the depression"                     DISCHARGE CRITERIA:  Ability to meet basic life and health needs Improved stabilization in mood, thinking, and/or behavior Verbal commitment to aftercare and medication compliance Withdrawal symptoms are absent or subacute and managed without 24-hour nursing intervention  PRELIMINARY DISCHARGE PLAN: Attend aftercare/continuing care group Return to previous living arrangement  PATIENT/FAMILY INVOLVEMENT: This treatment plan has been presented to and reviewed with the patient, Fernando Wilkinson, and/or family member, .  The patient and family have been given the opportunity to ask questions and make suggestions.  Fernando Wilkinson, [place], [place] 05/31/2018, 12:20 AM

## 2018-05-31 NOTE — [facility] Group Notes (Signed)
[facility] Group Therapy 05/31/2018 1:15pm  Type of Therapy: Mental Health Association Presentation  Participation Level: Active  Participation Quality: Attentive  Affect: Appropriate  Cognitive: Oriented  Insight: Developing/Improving  Engagement in Therapy: Engaged  Modes of Intervention: Discussion, Education and Socialization  Summary of Progress/Problems: Mental Health Association (MHA) Speaker came to talk about his personal journey with mental health. The pt processed ways by which to relate to the speaker. MHA speaker provided handouts and educational information pertaining to groups and services offered by the [facility]. Pt was engaged in speaker's presentation and was receptive to resources provided.    Rona Ravens, LCSW 05/31/2018 1:49 PM

## 2018-05-31 NOTE — [facility] Suicide Risk Assessment (Signed)
[facility] Admission Suicide Risk Assessment   Nursing information obtained from:  Patient Demographic factors:  Male, Caucasian, Low socioeconomic status, Living alone, Access to firearms Current Mental Status:  Suicidal ideation indicated by patient, Self-harm thoughts Loss Factors:  Decrease in vocational status, Legal issues, Financial problems / change in socioeconomic status Historical Factors:  Prior suicide attempts, Family history of mental illness or substance abuse Risk Reduction Factors:  Positive coping skills or problem solving skills  Total Time spent with patient: 1 hour Principal Problem: Severe recurrent major depression without psychotic features (HCC) Diagnosis:   Patient Active Problem List   Diagnosis Date Noted  . Severe recurrent major depression without psychotic features [facility]) [F33.2] 05/30/2018   Subjective Data:   Fernando Wilkinson is a 62 y/o M with history of MDD and alcohol use disorder who was admitted voluntarily from [place] ED where he presented with worsening depression, SI with plan to hang himself, and worsening use of alcohol in the context of medication non-adherence for the past 6 months. Pt was medically cleared and then transferred to [facility] for additional treatment and stabilization.  Upon initial evaluation, pt shares, "I've been feeling depressed - feeling like everything's gonna mess up." Pt cites stressors for feeling unsatisfied at work and having financial stressors. He also has limited social support. He identifies anxiety as his biggest concern and biggest factor related to alcohol use. He endorses depressive symptoms of depressed mood, poor sleep with initial insomnia, anhedonia, guilty feelings, low energy, poor concentration, poor appetite, and suicidal ideation. Pt reports he has been thinking about SI for several weeks, but he does not have plan or intent. He shares that when he saw a rope on his home, he had idea of hanging himself, but that  was the indicator for the patient to go to the ED and get additional help. He denies HI/AH/VH. He denies symptoms of mania, OCD, PTSD. He is drinking about a fifth to a pint of hard alcohol daily, and he smokes 1.5 ppd of tobacco. He rarely uses cannabis about a few times per year, and he denies other illicit substance use.  Discussed with patient about treatment options. He currently takes no medications. He denies having any symptoms of withdrawal currently, and he denies having alcohol withdrawal in the past. He agrees to trial of prozac and abilify. He will speak with SW team about possible referrals to substance use treatment programs. Pt was in agreement with the above plan, and he had no further questions, comments, or concerns.   Continued Clinical Symptoms:  Alcohol Use Disorder Identification Test Final Score (AUDIT): 23 The "Alcohol Use Disorders Identification Test", Guidelines for Use in Primary Care, Second Edition.  World Science writer [facility]). Score between 0-7:  no or low risk or alcohol related problems. Score between 8-15:  moderate risk of alcohol related problems. Score between 16-19:  high risk of alcohol related problems. Score 20 or above:  warrants further diagnostic evaluation for alcohol dependence and treatment.   CLINICAL FACTORS:   Severe Anxiety and/or Agitation Depression:   Severe Alcohol/Substance Abuse/Dependencies More than one psychiatric diagnosis Unstable or Poor Therapeutic Relationship Previous Psychiatric Diagnoses and Treatments Medical Diagnoses and Treatments/Surgeries   Musculoskeletal: Strength & Muscle Tone: within normal limits Gait & Station: normal Patient leans: N/A  Psychiatric Specialty Exam: Physical Exam  Nursing note and vitals reviewed.   Review of Systems  Constitutional: Negative for chills and fever.  Respiratory: Negative for cough and shortness of breath.  Cardiovascular: Negative for chest pain.   Gastrointestinal: Negative for abdominal pain, heartburn, nausea and vomiting.  Psychiatric/Behavioral: Positive for depression, substance abuse and suicidal ideas. Negative for hallucinations. The patient is nervous/anxious. The patient does not have insomnia.     Blood pressure 113/76, pulse 77, temperature (!) 97.3 F (36.3 C), temperature source Oral, resp. rate 18, height 5\' 11"  (1.803 m), weight 108.4 kg.Body mass index is 33.33 kg/m.  General Appearance: Casual and Disheveled  Eye Contact:  Good  Speech:  Clear and Coherent and Normal Rate  Volume:  Normal  Mood:  Anxious and Depressed  Affect:  Appropriate, Congruent and Constricted  Thought Process:  Coherent and Goal Directed  Orientation:  Full (Time, Place, and Person)  Thought Content:  Logical  Suicidal Thoughts:  Yes.  with intent/plan  Homicidal Thoughts:  No  Memory:  Immediate;   Fair Recent;   Fair Remote;   Fair  Judgement:  Poor  Insight:  Lacking  Psychomotor Activity:  Normal  Concentration:  Concentration: Fair  Recall:  [employer] of Knowledge:  Fair  Language:  Fair  Akathisia:  No  Handed:    AIMS (if indicated):     Assets:  Communication Skills Desire for Improvement Housing Physical Health Resilience  ADL's:  Intact  Cognition:  WNL  Sleep:         COGNITIVE FEATURES THAT CONTRIBUTE TO RISK:  None    SUICIDE RISK:   Moderate:  Frequent suicidal ideation with limited intensity, and duration, some specificity in terms of plans, no associated intent, good self-control, limited dysphoria/symptomatology, some risk factors present, and identifiable protective factors, including available and accessible social support.  PLAN OF CARE:   -Admit to inpatient level of care  -MDD, recurrent, severe, without psychosis   -Start prozac 20mg  po qDay   -Start abilify 5mg  po qDay  -DMII   -continue gemfibrozil 600mg  po BID   -Continue metformin 500mg  po qDay  -HTN   -Continue losartan 100mg   po qDay  -insomnia  -Continue trazodone 50mg  po qhs prn insomnia (may repeat x1)  -anxiety  -Continue vistaril 50mg  po q6h prn anxiety  -Encourage participation in groups and therapeutic milieu  -disposition planning will be ongoing  I certify that inpatient services furnished can reasonably be expected to improve the patient's condition.   [name], MD 05/31/2018, 4:12 PM

## 2018-05-31 NOTE — [facility] Suicide Risk Assessment (Signed)
[facility] INPATIENT:  Family/Significant Other Suicide Prevention Education  Suicide Prevention Education:  Patient Refusal for Family/Significant Other Suicide Prevention Education: The patient Fernando Wilkinson has refused to provide written consent for family/significant other to be provided Family/Significant Other Suicide Prevention Education during admission and/or prior to discharge.  Physician notified.  SPE completed with pt, as pt refused to consent to family contact. SPI pamphlet provided to pt and pt was encouraged to share information with support network, ask questions, and talk about any concerns relating to SPE. Pt denies access to guns/firearms and verbalized understanding of information provided. Mobile Crisis information also provided to pt.   Rona Ravens LCSW 05/31/2018, 1:18 PM

## 2018-05-31 NOTE — Progress Notes (Signed)
Fernando Wilkinson is a 62 year old male pt admitted on voluntary basis from [facility]. On admission he reports that he went to the hospital looking for help because he reports that he has been feeling depressed and suicidal and has had thoughts about hanging himself. He reports that he has been hospitalized previously but reports it's been about 20 years. He reports that he had been taking his medications but reports that he stopped taking his celexa about 6 months ago and reports that he did not think it was helping him anymore. He does endorse excessive alcohol usage and reports that he is an alcoholic and reports that he has been drinking more and more recently. He denies any withdrawal symptoms and does not display any overt signs or symptoms of withdrawal. He also reports that he lives alone and has no support systems in his life. He spoke about how he needs help in dealing with his depression. Ahnaf was escorted to the unit, oriented to the milieu and safety maintained.

## 2018-06-01 DIAGNOSIS — G4700 Insomnia, unspecified: Secondary | ICD-10-CM

## 2018-06-01 DIAGNOSIS — Z9114 Patient's other noncompliance with medication regimen: Secondary | ICD-10-CM

## 2018-06-01 DIAGNOSIS — Z7984 Long term (current) use of oral hypoglycemic drugs: Secondary | ICD-10-CM

## 2018-06-01 DIAGNOSIS — F17210 Nicotine dependence, cigarettes, uncomplicated: Secondary | ICD-10-CM

## 2018-06-01 DIAGNOSIS — F419 Anxiety disorder, unspecified: Secondary | ICD-10-CM

## 2018-06-01 DIAGNOSIS — F10239 Alcohol dependence with withdrawal, unspecified: Secondary | ICD-10-CM

## 2018-06-01 DIAGNOSIS — Z79899 Other long term (current) drug therapy: Secondary | ICD-10-CM

## 2018-06-01 DIAGNOSIS — E119 Type 2 diabetes mellitus without complications: Secondary | ICD-10-CM

## 2018-06-01 LAB — COMPREHENSIVE METABOLIC PANEL
ALBUMIN: 4 g/dL (ref 3.5–5.0)
ALT: 34 U/L (ref 0–44)
ANION GAP: 12 (ref 5–15)
AST: 36 U/L (ref 15–41)
Alkaline Phosphatase: 73 U/L (ref 38–126)
BUN: 26 mg/dL — ABNORMAL HIGH (ref 6–20)
CHLORIDE: 102 mmol/L (ref 98–111)
CO2: 26 mmol/L (ref 22–32)
Calcium: 8.9 mg/dL (ref 8.9–10.3)
Creatinine, Ser: 0.96 mg/dL (ref 0.61–1.24)
GFR calc Af Amer: >60 mL/min (ref >60)
GFR calc non Af Amer: >60 mL/min (ref >60)
Glucose, Bld: 128 mg/dL — ABNORMAL HIGH (ref 70–99)
POTASSIUM: 4.5 mmol/L (ref 3.5–5.1)
Sodium: 140 mmol/L (ref 135–145)
Total Bilirubin: 0.9 mg/dL (ref 0.3–1.2)
Total Protein: 7.3 g/dL (ref 6.5–8.1)

## 2018-06-01 LAB — HEMOGLOBIN A1C
HEMOGLOBIN A1C: 5.7 % — AB (ref 4.8–5.6)
MEAN PLASMA GLUCOSE: 117 mg/dL

## 2018-06-01 LAB — GLUCOSE, CAPILLARY
GLUCOSE-CAPILLARY: 136 mg/dL — AB (ref 70–99)
Glucose-Capillary: 116 mg/dL — ABNORMAL HIGH (ref 70–99)

## 2018-06-01 MED ORDER — ADULT MULTIVITAMIN W/MINERALS CH
1.0000 | ORAL_TABLET | Freq: Every day | ORAL | Status: DC
  Administered 2018-06-01 – 2018-06-06 (×6): 1 via ORAL
  Filled 2018-06-01 (×8): qty 1

## 2018-06-01 MED ORDER — LORAZEPAM 1 MG PO TABS: 1.0000 mg | ORAL_TABLET | Freq: Four times a day (QID) | ORAL | Status: AC | PRN

## 2018-06-01 MED ORDER — VITAMIN B-1 100 MG PO TABS
100.0000 mg | ORAL_TABLET | Freq: Every day | ORAL | Status: DC
  Administered 2018-06-02 – 2018-06-06 (×5): 100 mg via ORAL
  Filled 2018-06-01 (×7): qty 1

## 2018-06-01 MED ORDER — LOPERAMIDE HCL 2 MG PO CAPS: 2.0000 mg | ORAL_CAPSULE | ORAL | Status: AC | PRN

## 2018-06-01 MED ORDER — ONDANSETRON 4 MG PO TBDP: 4.0000 mg | ORAL_TABLET | Freq: Four times a day (QID) | ORAL | Status: AC | PRN

## 2018-06-01 NOTE — Progress Notes (Signed)
Two hour V/S completed.  He was easily awakened from sound sleep.  He was able to move in the bed without difficulty.  He denied any pain or discomfort.  O2 sats continue to remain around 90-91% on room air.  PERRLA.  Hand grips equal.  Blood sugar was 136.  VSS.

## 2018-06-01 NOTE — Progress Notes (Signed)
1:1 DAR Note: D:  Fernando Wilkinson has been up and in the day room this evening.  He denied SI/HI or A/V hallucinations.  He continues to be unsteady when getting up and sitter remains by his side.  He denied any pain or discomfort and appears to be in no physical distress.  VSS.  Discussed with him about his night time medication and he requested trazodone and vistaril for later. A:  1:1 remains for safety and fall risk.   R:  Fernando Wilkinson remains safe on the unit.

## 2018-06-01 NOTE — Progress Notes (Signed)
Recreation Therapy Notes  Date: 9.11.19 Time: 0930 Location: 300 Hall Dayroom  Group Topic: Stress Management  Goal Area(s) Addresses:  Patient will verbalize importance of using healthy stress management.  Patient will identify positive emotions associated with healthy stress management.   Behavioral Response: Engaged  Intervention: Stress Management  Activity :  Guided Imagery.  LRT introduced the stress management technique of guided imagery.  LRT read a script for patients to envision seeing a starry sky at night.  Patients were to follow along as script was read to engage in activity.  Education:  Stress Management, Discharge Planning.   Education Outcome: Acknowledges edcuation/In group clarification offered/Needs additional education  Clinical Observations/Feedback: Pt attended and participated in group.    Caroll Rancher, LRT/CTRS         Caroll Rancher A 06/01/2018 11:55 AM

## 2018-06-01 NOTE — Progress Notes (Signed)
1:1 observation note:  Patient has been unsteady on his feet.  Patient is minimal with staff.  He has not asked for any prn medication.  Continue 1:1 for safety concern.

## 2018-06-01 NOTE — Progress Notes (Signed)
[facility] MD Progress Note  06/01/2018 1:26 PM Fernando Wilkinson  MRN:  [id number]  Subjective: Fernando Wilkinson reports, "I'm just trying to rest. I passed out & fell last night. It has happened to me about 2 months ago at my house, no injuries. My mood is not too bad today. It is improving a little bit. I did not sleep well last night after 3:00 am. I'm depressed still some".  Fernando Wilkinson is a 62 y/o M with history of MDD and alcohol use disorder who was admitted voluntarily from [place] ED where he presented with worsening depression, SI with plan to hang himself, and worsening use of alcohol in the context of medication non-adherence for the past 6 months. Pt was medically cleared and then transferred to [facility] for additional treatment and stabilization. Upon initial evaluation, pt shares, "I've been feeling depressed - feeling like everything's gonna mess up." Pt cites stressors for feeling unsatisfied at work and having financial stressors. He also has limited social support. He identifies anxiety as his biggest concern and biggest factor related to alcohol use. He endorses depressive symptoms of depressed mood, poor sleep with initial insomnia, anhedonia, guilty feelings, low energy, poor concentration, poor appetite, and suicidal ideation.  Today, Fernando Wilkinson is seen in his room. He was lying down in his bed. He says he is trying to rest. He reported some improved symptoms, although he continues to endorse some symptoms of depression. He says he slept well up until 3:00 am last night. He reports that he passed out & fell last night. He reported hx of such fall at home, denies any injuries, He denies any pain or discomforts during this follow-up assessment. He is currently on 1:1 supervision for safety. Fernando Wilkinson says he is taking & tolerating his medication regimen. He denies any SIHI, AVH, delusional thoughts or paranoia. He continues to present somewhat bizarre, at times, slow to respond to questions. He has agreed  to continue current plan of care already in progress.  Principal Problem: Severe recurrent major depression without psychotic features ([place])  Diagnosis:   Patient Active Problem List   Diagnosis Date Noted  . Severe recurrent major depression without psychotic features ([place]) [F33.2] 05/30/2018    Priority: High  . Alcohol use disorder, severe, dependence ([place]) [F10.20] 05/31/2018   Total Time spent with patient: 25 minutes  Past Psychiatric History: See H&P.Marland Kitchen  Past Medical History:  Past Medical History:  Diagnosis Date  . Diabetes mellitus without complication ([place])   . Hypertension    History reviewed. No pertinent surgical history.  Family History: History reviewed. No pertinent family history.  Family Psychiatric  History: See H&P.  Social History:  Social History   Substance and Sexual Activity  Alcohol Use Yes   Comment: daily use     Social History   Substance and Sexual Activity  Drug Use Not Currently    Social History   Socioeconomic History  . Marital status: Single    Spouse name: Not on file  . Number of children: Not on file  . Years of education: Not on file  . Highest education level: Not on file  Occupational History  . Not on file  Social Needs  . Financial resource strain: Not on file  . Food insecurity:    Worry: Not on file    Inability: Not on file  . Transportation needs:    Medical: Not on file    Non-medical: Not on file  Tobacco Use  . Smoking status: Current  Every Day Smoker    Types: Cigarettes  . Smokeless tobacco: Never Used  Substance and Sexual Activity  . Alcohol use: Yes    Comment: daily use  . Drug use: Not Currently  . Sexual activity: Not Currently  Lifestyle  . Physical activity:    Days per week: Not on file    Minutes per session: Not on file  . Stress: Not on file  Relationships  . Social connections:    Talks on phone: Not on file    Gets together: Not on file    Attends religious service: Not on file     Active member of club or organization: Not on file    Attends meetings of clubs or organizations: Not on file    Relationship status: Not on file  Other Topics Concern  . Not on file  Social History Narrative  . Not on file   Additional Social History:    Pain Medications: See MAR Prescriptions: See MAR Over the Counter: See MAR History of alcohol / drug use?: Yes Longest period of sobriety (when/how long): 6 years Negative Consequences of Use: Legal Name of Substance 1: alcohol 1 - Age of First Use: 19 1 - Amount (size/oz): pint of liquor 1 - Frequency: 3-4 nights a week 1 - Duration: unknown 1 - Last Use / Amount: 05/29/2018  Sleep: Fair  Appetite:  Fair  Current Medications: Current Facility-Administered Medications  Medication Dose Route Frequency Provider Last Rate Last Dose  . acetaminophen (TYLENOL) tablet 650 mg  650 mg Oral Q6H PRN Lindon Romp A, NP      . alum & mag hydroxide-simeth (MAALOX/MYLANTA) 200-200-20 MG/5ML suspension 30 mL  30 mL Oral Q4H PRN Lindon Romp A, NP      . ARIPiprazole (ABILIFY) tablet 5 mg  5 mg Oral Daily Lindell Spar I, NP   5 mg at 06/01/18 7353  . FLUoxetine (PROZAC) capsule 20 mg  20 mg Oral Daily Lindell Spar I, NP   20 mg at 06/01/18 2992  . gemfibrozil (LOPID) tablet 600 mg  600 mg Oral BID Lindell Spar I, NP   600 mg at 06/01/18 4268  . hydrOXYzine (ATARAX/VISTARIL) tablet 50 mg  50 mg Oral Q6H PRN Pennelope Bracken, MD   50 mg at 06/01/18 1311  . Influenza vac split quadrivalent PF (FLUARIX) injection 0.5 mL  0.5 mL Intramuscular Tomorrow-1000 Cobos, Fernando A, MD      . loperamide (IMODIUM) capsule 2-4 mg  2-4 mg Oral PRN Lindon Romp A, NP      . LORazepam (ATIVAN) tablet 1 mg  1 mg Oral Q6H PRN Lindon Romp A, NP      . losartan (COZAAR) tablet 100 mg  100 mg Oral Daily Lindell Spar I, NP   100 mg at 06/01/18 3419  . magnesium hydroxide (MILK OF MAGNESIA) suspension 30 mL  30 mL Oral Daily PRN Lindon Romp A, NP      .  metFORMIN (GLUCOPHAGE-XR) 24 hr tablet 500 mg  500 mg Oral Daily Lindell Spar I, NP   500 mg at 06/01/18 6222  . multivitamin with minerals tablet 1 tablet  1 tablet Oral Daily Lindon Romp A, NP   1 tablet at 06/01/18 0838  . nicotine (NICODERM CQ - dosed in mg/24 hours) patch 21 mg  21 mg Transdermal Daily Cobos, Myer Peer, MD   21 mg at 06/01/18 0839  . ondansetron (ZOFRAN-ODT) disintegrating tablet 4 mg  4 mg Oral Q6H PRN  Rozetta Nunnery, NP      . pneumococcal 23 valent vaccine (PNU-IMMUNE) injection 0.5 mL  0.5 mL Intramuscular Tomorrow-1000 Cobos, Myer Peer, MD      . Fernando Wilkinson ON 06/02/2018] thiamine (VITAMIN B-1) tablet 100 mg  100 mg Oral Daily Lindon Romp A, NP      . traZODone (DESYREL) tablet 50 mg  50 mg Oral QHS PRN,MR X 1 Pennelope Bracken, MD   50 mg at 05/31/18 2221   Lab Results:  Results for orders placed or performed during the hospital encounter of 05/30/18 (from the past 48 hour(s))  Hemoglobin A1c     Status: Abnormal   Collection Time: 05/31/18  6:42 AM  Result Value Ref Range   Hgb A1c MFr Bld 5.7 (H) 4.8 - 5.6 %    Comment: (NOTE)         Prediabetes: 5.7 - 6.4         Diabetes: >6.4         Glycemic control for adults with diabetes: <7.0    Mean Plasma Glucose 117 mg/dL    Comment: (NOTE) Performed At: [facility] [place], [place] [id number] Rush Farmer MD DY:[id number]   Lipid panel     Status: Abnormal   Collection Time: 05/31/18  6:42 AM  Result Value Ref Range   Cholesterol 197 0 - 200 mg/dL   Triglycerides 203 (H) <150 mg/dL   HDL 41 >40 mg/dL   Total CHOL/HDL Ratio 4.8 RATIO   VLDL 41 (H) 0 - 40 mg/dL   LDL Cholesterol 115 (H) 0 - 99 mg/dL    Comment:        Total Cholesterol/HDL:CHD Risk Coronary Heart Disease Risk Table                     Men   Women  1/2 Average Risk   3.4   3.3  Average Risk       5.0   4.4  2 X Average Risk   9.6   7.1  3 X Average Risk  23.4   11.0        Use the calculated Patient  Ratio above and the CHD Risk Table to determine the patient's CHD Risk.        ATP III CLASSIFICATION (LDL):  <100     mg/dL   Optimal  100-129  mg/dL   Near or Above                    Optimal  130-159  mg/dL   Borderline  160-189  mg/dL   High  >190     mg/dL   Very High Performed at [facility] [address], [place], [place] [address]   TSH     Status: None   Collection Time: 05/31/18  6:42 AM  Result Value Ref Range   TSH 1.978 0.350 - 4.500 uIU/mL    Comment: Performed by a 3rd Generation assay with a functional sensitivity of <=0.01 uIU/mL. Performed at [facility], [place] [address], [place], [place] [address]   Glucose, capillary     Status: Abnormal   Collection Time: 06/01/18  3:03 AM  Result Value Ref Range   Glucose-Capillary 116 (H) 70 - 99 mg/dL  Glucose, capillary     Status: Abnormal   Collection Time: 06/01/18  5:04 AM  Result Value Ref Range   Glucose-Capillary 136 (H) 70 - 99 mg/dL  Comprehensive metabolic panel     Status: Abnormal   Collection Time: 06/01/18  6:33 AM  Result Value Ref Range   Sodium 140 135 - 145 mmol/L   Potassium 4.5 3.5 - 5.1 mmol/L   Chloride 102 98 - 111 mmol/L   CO2 26 22 - 32 mmol/L   Glucose, Bld 128 (H) 70 - 99 mg/dL   BUN 26 (H) 6 - 20 mg/dL   Creatinine, Ser 0.96 0.61 - 1.24 mg/dL   Calcium 8.9 8.9 - 10.3 mg/dL   Total Protein 7.3 6.5 - 8.1 g/dL   Albumin 4.0 3.5 - 5.0 g/dL   AST 36 15 - 41 U/L   ALT 34 0 - 44 U/L   Alkaline Phosphatase 73 38 - 126 U/L   Total Bilirubin 0.9 0.3 - 1.2 mg/dL   GFR calc non Af Amer >60 >60 mL/min   GFR calc Af Amer >60 >60 mL/min    Comment: (NOTE) The eGFR has been calculated using the CKD EPI equation. This calculation has not been validated in all clinical situations. eGFR's persistently <60 mL/min signify possible Chronic Kidney Disease.    Anion gap 12 5 - 15    Comment: Performed at [facility], [place] [address],  [place], [place] [address]   Blood Alcohol level:  No results found for: [facility]  Metabolic Disorder Labs: Lab Results  Component Value Date   HGBA1C 5.7 (H) 05/31/2018   MPG 117 05/31/2018   No results found for: PROLACTIN Lab Results  Component Value Date   CHOL 197 05/31/2018   TRIG 203 (H) 05/31/2018   HDL 41 05/31/2018   CHOLHDL 4.8 05/31/2018   VLDL 41 (H) 05/31/2018   LDLCALC 115 (H) 05/31/2018   Physical Findings: AIMS: Facial and Oral Movements Muscles of Facial Expression: None, normal Lips and Perioral Area: None, normal Jaw: None, normal Tongue: None, normal,Extremity Movements Upper (arms, wrists, hands, fingers): None, normal Lower (legs, knees, ankles, toes): None, normal, Trunk Movements Neck, shoulders, hips: None, normal, Overall Severity Severity of abnormal movements (highest score from questions above): None, normal Incapacitation due to abnormal movements: None, normal Patient's awareness of abnormal movements (rate only patient's report): No Awareness, Dental Status Current problems with teeth and/or dentures?: No Does patient usually wear dentures?: No  CIWA:  CIWA-Ar Total: 6 COWS:     Musculoskeletal: Strength & Muscle Tone: within normal limits Gait & Station: normal Patient leans: N/A  Psychiatric Specialty Exam: Physical Exam  Nursing note and vitals reviewed.   Review of Systems  Psychiatric/Behavioral: Positive for depression and substance abuse (Hx. alcoholism). Negative for hallucinations, memory loss and suicidal ideas. The patient has insomnia. The patient is not nervous/anxious.     Blood pressure 110/77, pulse (!) 103, temperature 98.3 F (36.8 C), temperature source Oral, resp. rate 20, height 5' 11"  (1.803 m), weight 108.4 kg, SpO2 94 %.Body mass index is 33.33 kg/m.  General Appearance: Casual and Disheveled  Eye Contact:  Good  Speech:  Clear and Coherent and Normal Rate  Volume:  Normal  Mood:  Anxious and Depressed  Affect:   Appropriate, Congruent and Constricted  Thought Process:  Coherent and Goal Directed  Orientation:  Full (Time, Place, and Person)  Thought Content:  Logical  Suicidal Thoughts:  Yes.  with intent/plan  Homicidal Thoughts:  No  Memory:  Immediate;   Fair Recent;   Fair Remote;   Fair  Judgement:  Poor  Insight:  Lacking  Psychomotor Activity:  Normal  Concentration:  Concentration: Fair  Recall:  [employer] of Knowledge:  Fair  Language:  Fair  Akathisia:  No  Handed:    AIMS (if indicated):     Assets:  Communication Skills Desire for Improvement Housing Physical Health Resilience  ADL's:  Intact  Cognition:  WNL  Sleep: 5.25       Treatment Plan Summary: Daily contact with patient to assess and evaluate symptoms and progress in treatment.  - Continue inpatient hospitalization.  - Will continue today 06/01/2018 plan as below except where it is noted.  Alcohol withdrawal.     - Continue Lorazepam 1 mg Q 6 hours prn for CIWA >10.  Mood control.     - Continue Abilify 5 mg po daily.  Depression.     - Continue Prozac 20 mg po daily.  Anxiety.     - Continue vistaril 50 mg po Q 6 hours prn.  Insomnia.     - Continue Trazodone 50 mg po Q bedtime prn, may repeat x 1.  Continue 1:1 supervision for safety till discontinued.  Other medical issues.     - Continue Metformin 500 mg po daily for diabetes.     - Continue Cozaar 100 mg po daily for HTN.      - Continue Lopid 600 mg po bid for high lipid.  Patient continue to attend group sessions.  Discharge disposition plan ongoing.  Lindell Spar, NP, PMHNP, FNP-BC 06/01/2018, 1:26 PM

## 2018-06-01 NOTE — Progress Notes (Signed)
Patient ID: Fernando Wilkinson, male   DOB: [birth year], 62 y.o.   MRN: [id number]   1:1 Note:  Patient came to desk requesting med for anxiety. Given Vistaril. Patient stated he felt better but reported dizziness at times. Patient eating well and drinking well. 1:1 maintained for safety. Patient is safe on the unit.

## 2018-06-01 NOTE — Progress Notes (Signed)
Yale remains unsteady on his feet.  He required 2 staff to assist him walking.  He has been complaining of dizziness at times.  He is very impulsive and will start walking even though staff have encouraged him to wait a minute before walking.  1:1 initiated for safety.  We will continue to monitor the progress towards his goals.

## 2018-06-01 NOTE — Progress Notes (Signed)
New orders noted from [facility] NP for lab work in AM and Detox prn protocol.

## 2018-06-01 NOTE — Progress Notes (Signed)
D:  Taegan has been in the day room much of the evening.  Minimal contact with staff or peers.  He reported passive SI and agreed to seek out staff if that worsens.  He denied HI or A/V hallucinations.  He appears to be reacting to internal stimuli as he would look around and smile before answering questions.  He had noticeable thought blocking and very minimal with answers.  He reported confusion with the treatment here at [facility] but unable to explain further.  He denied any pain or discomfort and appeared to be in no physical distress.  He denied withdrawal symptoms.  He was given vistaril and trazodone at bedtime.  He is currently resting with his eyes closed and appeared to be in no physical distress. A:  1:1 with RN for support and encouragement.  Medications as ordered.  Q 15 minute checks maintained for safety.  Encouraged participation in group and unit activities.   R:  Epigmenio remains safe on the unit.  We will continue to monitor the progress towards his goals.

## 2018-06-01 NOTE — Progress Notes (Signed)
Patient ID: Fernando Wilkinson, male   DOB: 1956/04/12, 62 y.o.   MRN: [id number]   Patient continues to remain unsteady on his feet and needs staff to assist him to stand at times. 1:1 needs to continue for safety. Patients vitals monitored closely.

## 2018-06-01 NOTE — Progress Notes (Signed)
1:1 Note:  Went in to check on patient.  He is asleep.  Patient continues to be 1:1 due safety reasons.

## 2018-06-01 NOTE — Progress Notes (Signed)
Patient examined after fall. Fall was witnessed by staff. Staff report that patient hit the right side of his head. Patient states that he did not fall that hard. Patient is alert and oriented x 4, speech is clear and coherent with delayed responses. PERRL. No bruising, redness, lacerations, or edema noted on head/scalp. MS 5/5 in all extremities. Respirations even and unlabored. Reports that he has had dizzy spells in the past that resulted in falls, but usually related to coughing. Will check CMP in the am and place on 1:1 for safety.

## 2018-06-01 NOTE — [facility] Group Notes (Signed)
LCSW Group Therapy Note 06/01/2018 10:40 AM  Type of Therapy/Topic: Group Therapy: Balance in Life  Participation Level: Active  Description of Group:  This group will address the concept of balance and how it feels and looks when one is unbalanced. Patients will be encouraged to process areas in their lives that are out of balance and identify reasons for remaining unbalanced. Facilitators will guide patients in utilizing problem-solving interventions to address and correct the stressor making their life unbalanced. Understanding and applying boundaries will be explored and addressed for obtaining and maintaining a balanced life. Patients will be encouraged to explore ways to assertively make their unbalanced needs known to significant others in their lives, using other group members and facilitator for support and feedback.  Therapeutic Goals: 1. Patient will identify two or more emotions or situations they have that consume much of in their lives. 2. Patient will identify signs/triggers that life has become out of balance:  3. Patient will identify two ways to set boundaries in order to achieve balance in their lives:  4. Patient will demonstrate ability to communicate their needs through discussion and/or role plays  Summary of Patient Progress:  Fernando Wilkinson was engaged and participated throughout the group session. Fernando Wilkinson reports that balance for him is "being healthy physically and mentally". Fernando Wilkinson reports that he plans on making "smarter decisions" in order to maintain balance once he discharges from the hospital    Therapeutic Modalities:  Cognitive Behavioral Therapy Solution-Focused Therapy Assertiveness Training   Alcario Drought Clinical Social Worker

## 2018-06-01 NOTE — Tx Team (Signed)
Interdisciplinary Treatment and Diagnostic Plan Update  06/01/2018 Time of Session: [id number] Sher Hellinger MRN: [id number]  Principal Diagnosis: Severe recurrent major depression without psychotic features [facility])  Secondary Diagnoses: Principal Problem:   Severe recurrent major depression without psychotic features (HCC) Active Problems:   Alcohol use disorder, severe, dependence (HCC)   Current Medications:  Current Facility-Administered Medications  Medication Dose Route Frequency Provider Last Rate Last Dose  . acetaminophen (TYLENOL) tablet 650 mg  650 mg Oral Q6H PRN [name], NP      . alum & mag hydroxide-simeth (MAALOX/MYLANTA) 200-200-20 MG/5ML suspension 30 mL  30 mL Oral Q4H PRN [name], NP      . ARIPiprazole (ABILIFY) tablet 5 mg  5 mg Oral Daily [name], NP   5 mg at 06/01/18 1027  . FLUoxetine (PROZAC) capsule 20 mg  20 mg Oral Daily [name], NP   20 mg at 06/01/18 2536  . gemfibrozil (LOPID) tablet 600 mg  600 mg Oral BID [name], NP   600 mg at 06/01/18 6440  . hydrOXYzine (ATARAX/VISTARIL) tablet 50 mg  50 mg Oral Q6H PRN [name], MD   50 mg at 05/31/18 2222  . Influenza vac split quadrivalent PF (FLUARIX) injection 0.5 mL  0.5 mL Intramuscular Tomorrow-1000 Cobos, Fernando A, MD      . loperamide (IMODIUM) capsule 2-4 mg  2-4 mg Oral PRN [name], NP      . LORazepam (ATIVAN) tablet 1 mg  1 mg Oral Q6H PRN [name], NP      . losartan (COZAAR) tablet 100 mg  100 mg Oral Daily [name], NP   100 mg at 06/01/18 3474  . magnesium hydroxide (MILK OF MAGNESIA) suspension 30 mL  30 mL Oral Daily PRN [name], NP      . metFORMIN (GLUCOPHAGE-XR) 24 hr tablet 500 mg  500 mg Oral Daily [name], NP   500 mg at 06/01/18 2595  . multivitamin with minerals tablet 1 tablet  1 tablet Oral Daily [name], NP   1 tablet at 06/01/18 0838  . nicotine (NICODERM CQ - dosed in mg/24 hours) patch 21 mg  21  mg Transdermal Daily Cobos, [name], MD   21 mg at 06/01/18 0839  . ondansetron (ZOFRAN-ODT) disintegrating tablet 4 mg  4 mg Oral Q6H PRN [name], NP      . pneumococcal 23 valent vaccine (PNU-IMMUNE) injection 0.5 mL  0.5 mL Intramuscular Tomorrow-1000 Cobos, [name], MD      . Melene Muller ON 06/02/2018] thiamine (VITAMIN B-1) tablet 100 mg  100 mg Oral Daily [name], NP      . traZODone (DESYREL) tablet 50 mg  50 mg Oral QHS PRN,MR X 1 [name], MD   50 mg at 05/31/18 2221   PTA Medications: Medications Prior to Admission  Medication Sig Dispense Refill Last Dose  . acetaminophen (TYLENOL) 500 MG tablet Take 1,000 mg by mouth every 6 (six) hours as needed for headache.   05/29/2018  . gemfibrozil (LOPID) 600 MG tablet Take 600 mg by mouth 2 (two) times daily.   05/30/2018  . losartan (COZAAR) 100 MG tablet Take 100 mg by mouth daily.   05/30/2018  . metFORMIN (GLUCOPHAGE-XR) 500 MG 24 hr tablet Take 500 mg by mouth daily.   05/30/2018    Patient Stressors: Health problems Medication change or noncompliance Occupational concerns  Substance abuse  Patient Strengths: Ability for insight Average or above average intelligence Capable of independent living General fund of knowledge  Treatment Modalities: Medication Management, Group therapy, Case management,  1 to 1 session with clinician, Psychoeducation, Recreational therapy.   Physician Treatment Plan for Primary Diagnosis: Severe recurrent major depression without psychotic features (HCC) Long Term Goal(s): Improvement in symptoms so as ready for discharge Improvement in symptoms so as ready for discharge   Short Term Goals: Ability to identify changes in lifestyle to reduce recurrence of condition will improve Ability to disclose and discuss suicidal ideas Ability to demonstrate self-control will improve Ability to identify and develop effective coping behaviors will improve Compliance with prescribed  medications will improve Ability to identify triggers associated with substance abuse/mental health issues will improve  Medication Management: Evaluate patient's response, side effects, and tolerance of medication regimen.  Therapeutic Interventions: 1 to 1 sessions, Unit Group sessions and Medication administration.  Evaluation of Outcomes: Progressing  Physician Treatment Plan for Secondary Diagnosis: Principal Problem:   Severe recurrent major depression without psychotic features (HCC) Active Problems:   Alcohol use disorder, severe, dependence (HCC)  Long Term Goal(s): Improvement in symptoms so as ready for discharge Improvement in symptoms so as ready for discharge   Short Term Goals: Ability to identify changes in lifestyle to reduce recurrence of condition will improve Ability to disclose and discuss suicidal ideas Ability to demonstrate self-control will improve Ability to identify and develop effective coping behaviors will improve Compliance with prescribed medications will improve Ability to identify triggers associated with substance abuse/mental health issues will improve     Medication Management: Evaluate patient's response, side effects, and tolerance of medication regimen.  Therapeutic Interventions: 1 to 1 sessions, Unit Group sessions and Medication administration.  Evaluation of Outcomes: Progressing   RN Treatment Plan for Primary Diagnosis: Severe recurrent major depression without psychotic features (HCC) Long Term Goal(s): Knowledge of disease and therapeutic regimen to maintain health will improve  Short Term Goals: Ability to remain free from injury will improve, Ability to demonstrate self-control, Ability to disclose and discuss suicidal ideas and Ability to identify and develop effective coping behaviors will improve  Medication Management: RN will administer medications as ordered by provider, will assess and evaluate patient's response and provide  education to patient for prescribed medication. RN will report any adverse and/or side effects to prescribing provider.  Therapeutic Interventions: 1 on 1 counseling sessions, Psychoeducation, Medication administration, Evaluate responses to treatment, Monitor vital signs and CBGs as ordered, Perform/monitor CIWA, COWS, AIMS and Fall Risk screenings as ordered, Perform wound care treatments as ordered.  Evaluation of Outcomes: Progressing   LCSW Treatment Plan for Primary Diagnosis: Severe recurrent major depression without psychotic features (HCC) Long Term Goal(s): Safe transition to appropriate next level of care at discharge, Engage patient in therapeutic group addressing interpersonal concerns.  Short Term Goals: Engage patient in aftercare planning with referrals and resources, Facilitate patient progression through stages of change regarding substance use diagnoses and concerns and Identify triggers associated with mental health/substance abuse issues  Therapeutic Interventions: Assess for all discharge needs, 1 to 1 time with Social worker, Explore available resources and support systems, Assess for adequacy in community support network, Educate family and significant other(s) on suicide prevention, Complete Psychosocial Assessment, Interpersonal group therapy.  Evaluation of Outcomes: Progressing   Progress in Treatment: Attending groups: Yes. Participating in groups: Yes. Taking medication as prescribed: Yes. Toleration medication: Yes. Family/Significant other contact made: SPE completed with pt; his  only identified support (girlfriend) is currently in jail.  Patient understands diagnosis: Yes. Discussing patient identified problems/goals with staff: Yes. Medical problems stabilized or resolved: Yes. Denies suicidal/homicidal ideation: Yes. Issues/concerns per patient self-inventory: No. Other: n/a   New problem(s) identified: Yes, Describe:  pt on 1:1 for safety due to fall  risk. states he fell last night "I don't know what happened. I just feel unsteady."   New Short Term/Long Term Goal(s): detox, medication management for mood stabilization; elimination of SI thoughts; development of comprehensive mental wellness/sobriety plan.   Patient Goals:  "I need to learn ways to deal with my depression and get help with alcoholism."   Discharge Plan or Barriers: CSW assessing. Pt plans to return home at discharge and follow-up at [facility] in [place]. MHAG pamphlet, Mobile Crisis information, and AA/NA information provided to patient for additional community support and resources.   Reason for Continuation of Hospitalization: Anxiety Depression Medication stabilization Suicidal ideation Withdrawal symptoms  Estimated Length of Stay: Monday, 06/06/18  Attendees: Patient: 06/01/2018 9:52 AM  Physician: Dr. Jola Babinski MD; Dr. Altamese [place] MD 06/01/2018 9:52 AM  Nursing: Selena Batten RN; Alyssa RN 06/01/2018 9:52 AM  RN Care Manager:x 06/01/2018 9:52 AM  Social Worker: Corrie Mckusick LCSW 06/01/2018 9:52 AM  Recreational Therapist: x 06/01/2018 9:52 AM  Other: Armandina Stammer NP 06/01/2018 9:52 AM  Other:  06/01/2018 9:52 AM  Other: 06/01/2018 9:52 AM    Scribe for Treatment Team: Rona Ravens, LCSW 06/01/2018 9:52 AM

## 2018-06-01 NOTE — Progress Notes (Signed)
Fernando Wilkinson ambulated alone up to the nurses station to request something else to help him sleep.  Nothing was available to give at that time so he requested cup of water. RN went to med room to obtain water.  He was witnessed by NT and RN's at the nurses station trying to sit down in the floor but then fell over.  He hit the right side of his head on the floor and door to day room.  Pt reported that he started feeling dizzy prior to the fall and was noted to be sweaty.  He was alert and oriented x 3.  PERRLA.  He denied any pain or discomfort.  No redness, broken skin, swelling or tenderness noted on right side of head.  He stated "I didn't hit my head that hard."  Vital signs obtained (BP 118/98-pulse 90-O2 sat 90% room air-Resp 18 and Temp 98.1).  Blood sugar was 116.  Charge nurse, AC and Nira Conn NP notified. Pt as no emergency contact to call.  He was able to ambulate back to his room with some assistance.  He was made high fall risk.  Yellow bracelet, yellow socks and stickers placed as well as new fall risk assessment completed.

## 2018-06-01 NOTE — Plan of Care (Signed)
  Problem: Activity: Goal: Sleeping patterns will improve Outcome: Progressing-He reported sleeping pretty well last night and is currently asleep

## 2018-06-02 NOTE — Progress Notes (Signed)
1:1 DAR Note: D:  Lindsey is currently resting with his eyes closed.  He appears to be asleep.  Breathing even and unlabored.  Sitter remains by his side for fall risk. A:  1:1 remains for safety. R:  Peretz remains safe on the unit.  We will continue to monitor the progress towards his goals. 

## 2018-06-02 NOTE — Progress Notes (Signed)
1:1 obs note -  Patient observed sitting in dayroom with peers. Patient states he is "feeling a little better." "I feel better from the day I fell. It's slow going though." Patient is very slow to respond. Unclear if he is thought blocking or simply fatigued from the day. Patient's affect flat, mood depressed. Denies pain, physical complaints though is still unsteady on feet.   No scheduled meds this evening however patient requests and was given prn trazadone given for sleep. Medication education provided. Level I obs in place for safety. Emotional support offered. Patient encouraged to complete Suicide Safety Plan before discharge. Encouraged to attend and participate in unit programming.  Fall prevention plan in place and reviewed with patient.   Patient verbalizes understanding of POC, falls prevention education. On reassess, patient is asleep. Patient denies SI/HI/AVH and remains safe on l1:1 obs. Will continue to monitor throughout the night.

## 2018-06-02 NOTE — Progress Notes (Signed)
[facility] Group Notes:  (Nursing/MHT/Case Management/Adjunct)  Date:  06/02/2018  Time:  2045  Type of Therapy:  wrap up group  Participation Level:  Active  Participation Quality:  Appropriate, Attentive, Sharing and Supportive  Affect:  Blunted and Flat  Cognitive:  Confused  Insight:  Improving  Engagement in Group:  Engaged  Modes of Intervention:  Clarification, Education and Support  Summary of Progress/Problems: Pt had extended pauses before speaking. As if he was trying hard to formulate what he wanted to say more than thought blocking. Pt shares that his mood is improved today and attributes it to "maybe the medicine". Pt reports having some friends whom are somewhat supportive and wanting to turn all affairs over to God and that some peoples egos are too big. Pt was not clear about what or whom he was speaking. Pt also inquired, when asked of he had any questions, about the cause of his fall and spoke about lack of oxygen since he was a smoker. Pt somewhat disorganized but seems more like having a hard time communicating thoughts.    Marcille BuffyMcNeil, Shaelin Lalley S 06/02/2018, 10:12 PM

## 2018-06-02 NOTE — Progress Notes (Signed)
[facility] MD Progress Note  06/02/2018 1:17 PM Fernando Wilkinson  MRN:  [id number]  Subjective: Fernando Wilkinson reports, "I'm feeling some better. I have been coming out of my room going to groups. I take the medicines. The vistaril helps. I slept well last night, I just feel drained today, that is all"  Fernando Wilkinson is a 62 y/o M with history of MDD and alcohol use disorder who was admitted voluntarily from [place] ED where he presented with worsening depression, SI with plan to hang himself, and worsening use of alcohol in the context of medication non-adherence for the past 6 months. Pt was medically cleared and then transferred to [facility] for additional treatment and stabilization. Upon initial evaluation, pt shares, "I've been feeling depressed - feeling like everything's gonna mess up." Pt cites stressors for feeling unsatisfied at work and having financial stressors. He also has limited social support. He identifies anxiety as his biggest concern and biggest factor related to alcohol use. He endorses depressive symptoms of depressed mood, poor sleep with initial insomnia, anhedonia, guilty feelings, low energy, poor concentration, poor appetite, and suicidal ideation.  Today, Fernando Wilkinson is seen, chart reviewed. The chart findings discussed with the treatment team. He says he is doing some better. He is visible on the unit, attending group sessions. He says he slept well last night. He remains on the 1:1 supervision for safety. He denies any pain or discomforts Fernando Wilkinson says he is taking & tolerating his medication regimen. He denies any SIHI, AVH, delusional thoughts or paranoia. He continues to present somewhat uncoordinated with his thought process, at times, slow to respond to questions. He has agreed to continue current plan of care already in progress. No changes made.  Principal Problem: Severe recurrent major depression without psychotic features ([place])  Diagnosis:   Patient Active Problem List   Diagnosis Date Noted  . Severe recurrent major depression without psychotic features ([place]) [F33.2] 05/30/2018    Priority: High  . Alcohol use disorder, severe, dependence ([place]) [F10.20] 05/31/2018   Total Time spent with patient: 25 minutes  Past Psychiatric History: See H&P.Marland Kitchen  Past Medical History:  Past Medical History:  Diagnosis Date  . Diabetes mellitus without complication ([place])   . Hypertension    History reviewed. No pertinent surgical history.  Family History: History reviewed. No pertinent family history.  Family Psychiatric  History: See H&P.  Social History:  Social History   Substance and Sexual Activity  Alcohol Use Yes   Comment: daily use     Social History   Substance and Sexual Activity  Drug Use Not Currently    Social History   Socioeconomic History  . Marital status: Single    Spouse name: Not on file  . Number of children: Not on file  . Years of education: Not on file  . Highest education level: Not on file  Occupational History  . Not on file  Social Needs  . Financial resource strain: Not on file  . Food insecurity:    Worry: Not on file    Inability: Not on file  . Transportation needs:    Medical: Not on file    Non-medical: Not on file  Tobacco Use  . Smoking status: Current Every Day Smoker    Types: Cigarettes  . Smokeless tobacco: Never Used  Substance and Sexual Activity  . Alcohol use: Yes    Comment: daily use  . Drug use: Not Currently  . Sexual activity: Not Currently  Lifestyle  .  Physical activity:    Days per week: Not on file    Minutes per session: Not on file  . Stress: Not on file  Relationships  . Social connections:    Talks on phone: Not on file    Gets together: Not on file    Attends religious service: Not on file    Active member of club or organization: Not on file    Attends meetings of clubs or organizations: Not on file    Relationship status: Not on file  Other Topics Concern  . Not on file   Social History Narrative  . Not on file   Additional Social History:    Pain Medications: See MAR Prescriptions: See MAR Over the Counter: See MAR History of alcohol / drug use?: Yes Longest period of sobriety (when/how long): 6 years Negative Consequences of Use: Legal Name of Substance 1: alcohol 1 - Age of First Use: 19 1 - Amount (size/oz): pint of liquor 1 - Frequency: 3-4 nights a week 1 - Duration: unknown 1 - Last Use / Amount: 05/29/2018  Sleep: Fair  Appetite:  Fair  Current Medications: Current Facility-Administered Medications  Medication Dose Route Frequency Provider Last Rate Last Dose  . acetaminophen (TYLENOL) tablet 650 mg  650 mg Oral Q6H PRN Lindon Romp A, NP      . alum & mag hydroxide-simeth (MAALOX/MYLANTA) 200-200-20 MG/5ML suspension 30 mL  30 mL Oral Q4H PRN Lindon Romp A, NP      . ARIPiprazole (ABILIFY) tablet 5 mg  5 mg Oral Daily Lindell Spar I, NP   5 mg at 06/02/18 0748  . FLUoxetine (PROZAC) capsule 20 mg  20 mg Oral Daily Lindell Spar I, NP   20 mg at 06/02/18 0748  . gemfibrozil (LOPID) tablet 600 mg  600 mg Oral BID Lindell Spar I, NP   600 mg at 06/02/18 0748  . hydrOXYzine (ATARAX/VISTARIL) tablet 50 mg  50 mg Oral Q6H PRN Pennelope Bracken, MD   50 mg at 06/02/18 0751  . loperamide (IMODIUM) capsule 2-4 mg  2-4 mg Oral PRN Lindon Romp A, NP      . LORazepam (ATIVAN) tablet 1 mg  1 mg Oral Q6H PRN Lindon Romp A, NP      . losartan (COZAAR) tablet 100 mg  100 mg Oral Daily Lindell Spar I, NP   100 mg at 06/02/18 0748  . magnesium hydroxide (MILK OF MAGNESIA) suspension 30 mL  30 mL Oral Daily PRN Lindon Romp A, NP      . metFORMIN (GLUCOPHAGE-XR) 24 hr tablet 500 mg  500 mg Oral Daily Lindell Spar I, NP   500 mg at 06/02/18 0748  . multivitamin with minerals tablet 1 tablet  1 tablet Oral Daily Lindon Romp A, NP   1 tablet at 06/02/18 0748  . nicotine (NICODERM CQ - dosed in mg/24 hours) patch 21 mg  21 mg Transdermal Daily Cobos,  Myer Peer, MD   21 mg at 06/02/18 0749  . ondansetron (ZOFRAN-ODT) disintegrating tablet 4 mg  4 mg Oral Q6H PRN Lindon Romp A, NP      . thiamine (VITAMIN B-1) tablet 100 mg  100 mg Oral Daily Lindon Romp A, NP   100 mg at 06/02/18 0748  . traZODone (DESYREL) tablet 50 mg  50 mg Oral QHS PRN,MR X 1 Pennelope Bracken, MD   50 mg at 06/01/18 2204   Lab Results:  Results for orders placed or performed during  the hospital encounter of 05/30/18 (from the past 48 hour(s))  Glucose, capillary     Status: Abnormal   Collection Time: 06/01/18  3:03 AM  Result Value Ref Range   Glucose-Capillary 116 (H) 70 - 99 mg/dL  Glucose, capillary     Status: Abnormal   Collection Time: 06/01/18  5:04 AM  Result Value Ref Range   Glucose-Capillary 136 (H) 70 - 99 mg/dL  Comprehensive metabolic panel     Status: Abnormal   Collection Time: 06/01/18  6:33 AM  Result Value Ref Range   Sodium 140 135 - 145 mmol/L   Potassium 4.5 3.5 - 5.1 mmol/L   Chloride 102 98 - 111 mmol/L   CO2 26 22 - 32 mmol/L   Glucose, Bld 128 (H) 70 - 99 mg/dL   BUN 26 (H) 6 - 20 mg/dL   Creatinine, Ser 0.96 0.61 - 1.24 mg/dL   Calcium 8.9 8.9 - 10.3 mg/dL   Total Protein 7.3 6.5 - 8.1 g/dL   Albumin 4.0 3.5 - 5.0 g/dL   AST 36 15 - 41 U/L   ALT 34 0 - 44 U/L   Alkaline Phosphatase 73 38 - 126 U/L   Total Bilirubin 0.9 0.3 - 1.2 mg/dL   GFR calc non Af Amer >60 >60 mL/min   GFR calc Af Amer >60 >60 mL/min    Comment: (NOTE) The eGFR has been calculated using the CKD EPI equation. This calculation has not been validated in all clinical situations. eGFR's persistently <60 mL/min signify possible Chronic Kidney Disease.    Anion gap 12 5 - 15    Comment: Performed at [facility], [place] [address], [place], [place] [address]   Blood Alcohol level:  No results found for: [facility]  Metabolic Disorder Labs: Lab Results  Component Value Date   HGBA1C 5.7 (H) 05/31/2018   MPG 117 05/31/2018   No  results found for: PROLACTIN Lab Results  Component Value Date   CHOL 197 05/31/2018   TRIG 203 (H) 05/31/2018   HDL 41 05/31/2018   CHOLHDL 4.8 05/31/2018   VLDL 41 (H) 05/31/2018   LDLCALC 115 (H) 05/31/2018   Physical Findings: AIMS: Facial and Oral Movements Muscles of Facial Expression: None, normal Lips and Perioral Area: None, normal Jaw: None, normal Tongue: None, normal,Extremity Movements Upper (arms, wrists, hands, fingers): None, normal Lower (legs, knees, ankles, toes): None, normal, Trunk Movements Neck, shoulders, hips: None, normal, Overall Severity Severity of abnormal movements (highest score from questions above): None, normal Incapacitation due to abnormal movements: None, normal Patient's awareness of abnormal movements (rate only patient's report): No Awareness, Dental Status Current problems with teeth and/or dentures?: No Does patient usually wear dentures?: No  CIWA:  CIWA-Ar Total: 1 COWS:     Musculoskeletal: Strength & Muscle Tone: within normal limits Gait & Station: normal Patient leans: N/A  Psychiatric Specialty Exam: Physical Exam  Nursing note and vitals reviewed.   Review of Systems  Psychiatric/Behavioral: Positive for depression and substance abuse (Hx. alcoholism). Negative for hallucinations, memory loss and suicidal ideas. The patient has insomnia. The patient is not nervous/anxious.     Blood pressure 120/76, pulse 90, temperature 97.9 F (36.6 C), temperature source Oral, resp. rate 16, height 5' 11"  (1.803 m), weight 108.4 kg, SpO2 98 %.Body mass index is 33.33 kg/m.  General Appearance: Casual and Disheveled  Eye Contact:  Good  Speech:  Clear and Coherent and Normal Rate  Volume:  Normal  Mood:  Anxious and Depressed  Affect:  Appropriate, Congruent and Constricted  Thought Process:  Coherent and Goal Directed  Orientation:  Full (Time, Place, and Person)  Thought Content:  Logical  Suicidal Thoughts:  Yes.  with  intent/plan  Homicidal Thoughts:  No  Memory:  Immediate;   Fair Recent;   Fair Remote;   Fair  Judgement:  Poor  Insight:  Lacking  Psychomotor Activity:  Normal  Concentration:  Concentration: Fair  Recall:  [employer] of Knowledge:  Fair  Language:  Fair  Akathisia:  No  Handed:    AIMS (if indicated):     Assets:  Communication Skills Desire for Improvement Housing Physical Health Resilience  ADL's:  Intact  Cognition:  WNL  Sleep: 5.25       Treatment Plan Summary: Daily contact with patient to assess and evaluate symptoms and progress in treatment.  - Continue inpatient hospitalization.  - Will continue today 06/02/2018 plan as below except where it is noted.  Alcohol withdrawal.     - Continue Lorazepam 1 mg Q 6 hours prn for CIWA >10.  Mood control.     - Continue Abilify 5 mg po daily.  Depression.     - Continue Prozac 20 mg po daily.  Anxiety.     - Continue vistaril 50 mg po Q 6 hours prn.  Insomnia.     - Continue Trazodone 50 mg po Q bedtime prn, may repeat x 1.  Continue 1:1 supervision for safety till discontinued.  Other medical issues.     - Continue Metformin 500 mg po daily for diabetes.     - Continue Cozaar 100 mg po daily for HTN.     - Continue Lopid 600 mg po bid for high lipid.  Patient continue to attend group sessions.  Discharge disposition plan ongoing.  Lindell Spar, NP, PMHNP, FNP-BC 06/02/2018, 1:17 PMPatient ID: Fernando Wilkinson, male   DOB: 26-Dec-1955, 62 y.o.   MRN: [id number]

## 2018-06-02 NOTE — Plan of Care (Signed)
  Problem: Education: Goal: Emotional status will improve Outcome: Progressing   Problem: Education: Goal: Mental status will improve Outcome: Progressing   Problem: Education: Goal: Verbalization of understanding the information provided will improve Outcome: Progressing   Problem: Activity: Goal: Interest or engagement in activities will improve Outcome: Progressing   Patient was anxious and was thought blocking during medication pass. Writer would ask patient questions, and patient either would not answer or would take about 10-20 seconds to respond. Denies SI HI AVH. Voiced no complaints other than anxiety. Vistaril PRN given. Patient tolerated flu and pneumonia vaccines well.  Safety is maintained with 15 minute checks. Will continue to monitor.

## 2018-06-02 NOTE — Progress Notes (Signed)
1:1 DAR Note: D:  Fernando Wilkinson is currently resting with his eyes closed.  He appears to be asleep.  Breathing even and unlabored.  Sitter remains by his side for fall risk. A:  1:1 remains for safety. R:  Fernando Wilkinson remains safe on the unit.  We will continue to monitor the progress towards his goals.

## 2018-06-02 NOTE — Progress Notes (Signed)
1:1 Note: D: Fernando Wilkinson is currently sitting in the day room. Breathing even and unlabored. Sitter remains by his side for fall risk. A: 1:1 remains for safety. R: Fernando Wilkinson remains safe on the unit. Will continue to monitor.

## 2018-06-02 NOTE — Progress Notes (Signed)
1:1 Note: D: Fernando Wilkinson is currently sitting in the day room. Breathing even and unlabored. Sitter remains by his side for fall risk. A: 1:1 remains for safety. R: Rashon remains safe on the unit. Will continue to monitor. 

## 2018-06-02 NOTE — Progress Notes (Signed)
1:1 Note: D:  Marcial Pacasimothy is currently resting with his eyes closed.  Breathing even and unlabored.  Sitter remains by his side for fall risk. A:  1:1 remains for safety. R:  Marcial Pacasimothy remains safe on the unit.  Will continue to monitor.

## 2018-06-02 NOTE — [facility] Group Notes (Signed)
LCSW Group Therapy Note   06/02/2018 1:15pm   Type of Therapy and Topic:  Group Therapy:  Overcoming Obstacles   Participation Level:  Did Not Attend--pt invited. Chose to remain in bed.    Description of Group:    In this group patients will be encouraged to explore what they see as obstacles to their own wellness and recovery. They will be guided to discuss their thoughts, feelings, and behaviors related to these obstacles. The group will process together ways to cope with barriers, with attention given to specific choices patients can make. Each patient will be challenged to identify changes they are motivated to make in order to overcome their obstacles. This group will be process-oriented, with patients participating in exploration of their own experiences as well as giving and receiving support and challenge from other group members.   Therapeutic Goals: 1. Patient will identify personal and current obstacles as they relate to admission. 2. Patient will identify barriers that currently interfere with their wellness or overcoming obstacles.  3. Patient will identify feelings, thought process and behaviors related to these barriers. 4. Patient will identify two changes they are willing to make to overcome these obstacles:      Summary of Patient Progress   x   Therapeutic Modalities:   Cognitive Behavioral Therapy Solution Focused Therapy Motivational Interviewing Relapse Prevention Therapy  Rona RavensHeather S Skyler Dusing, LCSW 06/02/2018 11:43 AM

## 2018-06-03 NOTE — Progress Notes (Signed)
[facility] Post 1:1 Observation Documentation   Time 1:1 discontinued:  09:30   Patient's Behavior:  Appropriate.  Patient's Condition:  No distress.  Patient's Conversation:  Appropriate.  Fernando Wilkinson  Fernando Wilkinson 06/03/2018, 3:30 PM

## 2018-06-03 NOTE — [facility] Group Notes (Signed)
LCSW Group Therapy Note  06/03/2018 1:15pm  Type of Therapy and Topic:  Group Therapy:  Feelings around Relapse and Recovery  Participation Level:  None   Description of Group:    Patients in this group will discuss emotions they experience before and after a relapse. They will process how experiencing these feelings, or avoidance of experiencing them, relates to having a relapse. Facilitator will guide patients to explore emotions they have related to recovery. Patients will be encouraged to process which emotions are more powerful. They will be guided to discuss the emotional reaction significant others in their lives may have to their relapse or recovery. Patients will be assisted in exploring ways to respond to the emotions of others without this contributing to a relapse.  Therapeutic Goals: 1. Patient will identify two or more emotions that lead to a relapse for them 2. Patient will identify two emotions that result when they relapse 3. Patient will identify two emotions related to recovery 4. Patient will demonstrate ability to communicate their needs through discussion and/or role plays   Summary of Patient Progress:  Pt attended group late and did not actively participate in discussion. Pt appeared drowsy and inattentive. At this time, Fernando Wilkinson does not appear to be making progress in the group setting.   Therapeutic Modalities:   Cognitive Behavioral Therapy Solution-Focused Therapy Assertiveness Training Relapse Prevention Therapy   Rona RavensHeather S Lakrisha Iseman, LCSW 06/03/2018 1:01 PM

## 2018-06-03 NOTE — Progress Notes (Addendum)
[facility] Post 1:1 Observation Documentation   Time 1:1 discontinued:  09:30   Patient's Behavior:  Appropriate.  Patient's Condition:  No distress.  Patient's Conversation:  Appropriate.  Fernando Wilkinson 06/03/2018, 11:30 AM

## 2018-06-03 NOTE — Progress Notes (Addendum)
[facility] MD Progress Note  06/03/2018 1:45 PM Fernando Wilkinson  MRN:  [id number]  Subjective: [name] reports, "I'm doing okay, just tired for some reason. My mood is a little better. I am benefiting most from the anxiety medicine. I think I'm sleeping a lot, about 10 hours daily. I think that is because, there are not much to do here than go groups".  Fernando Wilkinson is a 62 y/o M with history of MDD and alcohol use disorder who was admitted voluntarily from [place] ED where he presented with worsening depression, SI with plan to hang himself, and worsening use of alcohol in the context of medication non-adherence for the past 6 months. Pt was medically cleared and then transferred to [facility] for additional treatment and stabilization. Upon initial evaluation, pt shares, "I've been feeling depressed - feeling like everything's gonna mess up." Pt cites stressors for feeling unsatisfied at work and having financial stressors. He also has limited social support. He identifies anxiety as his biggest concern and biggest factor related to alcohol use. He endorses depressive symptoms of depressed mood, poor sleep with initial insomnia, anhedonia, guilty feelings, low energy, poor concentration, poor appetite, and suicidal ideation.  Today, Fernando Wilkinson is seen, chart reviewed. The chart findings discussed with the treatment team. He says he is doing some better. He is visible on the unit, attending group sessions. He says he is sleeping a lot, about 10 hours daily. He has been released from the 1:1 supervision for safety. He is feeling a lot stronger & no longer at risks for fall. He presents today future oriented. He is thinking a head about getting discharged, arranging for his ride & where to follow-up care after discharge. He denies any pain or discomforts [name] says he is taking & tolerating his medication regimen. He denies any SIHI, AVH, delusional thoughts or paranoia. His thought process is mor organized &  logical. He has agreed to continue current plan of care already in progress. No changes made.  Principal Problem: Severe recurrent major depression without psychotic features (HCC)  Diagnosis:   Patient Active Problem List   Diagnosis Date Noted  . Severe recurrent major depression without psychotic features (HCC) [F33.2] 05/30/2018    Priority: High  . Alcohol use disorder, severe, dependence (HCC) [F10.20] 05/31/2018   Total Time spent with patient: 15 minutes  Past Psychiatric History: See H&P.Marland Kitchen  Past Medical History:  Past Medical History:  Diagnosis Date  . Diabetes mellitus without complication (HCC)   . Hypertension    History reviewed. No pertinent surgical history.  Family History: History reviewed. No pertinent family history.  Family Psychiatric  History: See H&P.  Social History:  Social History   Substance and Sexual Activity  Alcohol Use Yes   Comment: daily use     Social History   Substance and Sexual Activity  Drug Use Not Currently    Social History   Socioeconomic History  . Marital status: Single    Spouse name: Not on file  . Number of children: Not on file  . Years of education: Not on file  . Highest education level: Not on file  Occupational History  . Not on file  Social Needs  . Financial resource strain: Not on file  . Food insecurity:    Worry: Not on file    Inability: Not on file  . Transportation needs:    Medical: Not on file    Non-medical: Not on file  Tobacco Use  . Smoking status:  Current Every Day Smoker    Types: Cigarettes  . Smokeless tobacco: Never Used  Substance and Sexual Activity  . Alcohol use: Yes    Comment: daily use  . Drug use: Not Currently  . Sexual activity: Not Currently  Lifestyle  . Physical activity:    Days per week: Not on file    Minutes per session: Not on file  . Stress: Not on file  Relationships  . Social connections:    Talks on phone: Not on file    Gets together: Not on file     Attends religious service: Not on file    Active member of club or organization: Not on file    Attends meetings of clubs or organizations: Not on file    Relationship status: Not on file  Other Topics Concern  . Not on file  Social History Narrative  . Not on file   Additional Social History:    Pain Medications: See MAR Prescriptions: See MAR Over the Counter: See MAR History of alcohol / drug use?: Yes Longest period of sobriety (when/how long): 6 years Negative Consequences of Use: Legal Name of Substance 1: alcohol 1 - Age of First Use: 19 1 - Amount (size/oz): pint of liquor 1 - Frequency: 3-4 nights a week 1 - Duration: unknown 1 - Last Use / Amount: 05/29/2018  Sleep: Good  Appetite:  Fair  Current Medications: Current Facility-Administered Medications  Medication Dose Route Frequency Provider Last Rate Last Dose  . acetaminophen (TYLENOL) tablet 650 mg  650 mg Oral Q6H PRN [name], NP      . alum & mag hydroxide-simeth (MAALOX/MYLANTA) 200-200-20 MG/5ML suspension 30 mL  30 mL Oral Q4H PRN [name], NP      . ARIPiprazole (ABILIFY) tablet 5 mg  5 mg Oral Daily [name], NP   5 mg at 06/03/18 0828  . FLUoxetine (PROZAC) capsule 20 mg  20 mg Oral Daily [name], NP   20 mg at 06/03/18 0828  . gemfibrozil (LOPID) tablet 600 mg  600 mg Oral BID [name], NP   600 mg at 06/03/18 0828  . hydrOXYzine (ATARAX/VISTARIL) tablet 50 mg  50 mg Oral Q6H PRN [name], MD   50 mg at 06/03/18 0831  . loperamide (IMODIUM) capsule 2-4 mg  2-4 mg Oral PRN [name], NP      . LORazepam (ATIVAN) tablet 1 mg  1 mg Oral Q6H PRN [name], NP      . losartan (COZAAR) tablet 100 mg  100 mg Oral Daily [name], NP   100 mg at 06/03/18 0828  . magnesium hydroxide (MILK OF MAGNESIA) suspension 30 mL  30 mL Oral Daily PRN [name], NP      . metFORMIN (GLUCOPHAGE-XR) 24 hr tablet 500 mg  500 mg Oral Daily [name], NP    500 mg at 06/03/18 0828  . multivitamin with minerals tablet 1 tablet  1 tablet Oral Daily [name], NP   1 tablet at 06/03/18 0828  . nicotine (NICODERM CQ - dosed in mg/24 hours) patch 21 mg  21 mg Transdermal Daily Cobos, [name], MD   21 mg at 06/03/18 0829  . ondansetron (ZOFRAN-ODT) disintegrating tablet 4 mg  4 mg Oral Q6H PRN [name], NP      . thiamine (VITAMIN B-1) tablet 100 mg  100 mg Oral Daily Nira Conn  A, NP   100 mg at 06/03/18 0828  . traZODone (DESYREL) tablet 50 mg  50 mg Oral QHS PRN,MR X 1 Micheal Likensainville, Christopher T, MD   50 mg at 06/02/18 2138   Lab Results:  No results found for this or any previous visit (from the past 48 hour(s)). Blood Alcohol level:  [facility] results found for: [facility]  Metabolic Disorder Labs: Lab Results  Component Value Date   HGBA1C 5.7 (H) 05/31/2018   MPG 117 05/31/2018   No results found for: PROLACTIN Lab Results  Component Value Date   CHOL 197 05/31/2018   TRIG 203 (H) 05/31/2018   HDL 41 05/31/2018   CHOLHDL 4.8 05/31/2018   VLDL 41 (H) 05/31/2018   LDLCALC 115 (H) 05/31/2018   Physical Findings: AIMS: Facial and Oral Movements Muscles of Facial Expression: None, normal Lips and Perioral Area: None, normal Jaw: None, normal Tongue: None, normal,Extremity Movements Upper (arms, wrists, hands, fingers): None, normal Lower (legs, knees, ankles, toes): None, normal, Trunk Movements Neck, shoulders, hips: None, normal, Overall Severity Severity of abnormal movements (highest score from questions above): None, normal Incapacitation due to abnormal movements: None, normal Patient's awareness of abnormal movements (rate only patient's report): No Awareness, Dental Status Current problems with teeth and/or dentures?: No Does patient usually wear dentures?: No  CIWA:  CIWA-Ar Total: 5 COWS:     Musculoskeletal: Strength & Muscle Tone: within normal limits Gait & Station: normal Patient leans: N/A  Psychiatric  Specialty Exam: Physical Exam  Nursing note and vitals reviewed.   Review of Systems  Psychiatric/Behavioral: Positive for depression and substance abuse (Hx. alcoholism). Negative for hallucinations, memory loss and suicidal ideas. The patient has insomnia. The patient is not nervous/anxious.     Blood pressure 105/76, pulse 81, temperature 97.9 F (36.6 C), temperature source Oral, resp. rate 16, height 5\' 11"  (1.803 m), weight 108.4 kg, SpO2 98 %.Body mass index is 33.33 kg/m.  General Appearance: Casual and Disheveled  Eye Contact:  Good  Speech:  Clear and Coherent and Normal Rate  Volume:  Normal  Mood:  Anxious and Depressed  Affect:  Appropriate, Congruent and Constricted  Thought Process:  Coherent and Goal Directed  Orientation:  Full (Time, Place, and Person)  Thought Content:  Logical  Suicidal Thoughts:  Yes.  with intent/plan  Homicidal Thoughts:  No  Memory:  Immediate;   Fair Recent;   Fair Remote;   Fair  Judgement:  Poor  Insight:  Lacking  Psychomotor Activity:  Normal  Concentration:  Concentration: Fair  Recall:  [employer]Fair  Fund of Knowledge:  Fair  Language:  Fair  Akathisia:  No  Handed:    AIMS (if indicated):     Assets:  Communication Skills Desire for Improvement Housing Physical Health Resilience  ADL's:  Intact  Cognition:  WNL  Sleep: 5.25       Treatment Plan Summary: Daily contact with patient to assess and evaluate symptoms and progress in treatment.  - Continue inpatient hospitalization.  - Will continue today 06/03/2018 plan as below except where it is noted.  Alcohol withdrawal.     - Continue Lorazepam 1 mg Q 6 hours prn for CIWA >10.  Mood control.     - Continue Abilify 5 mg po daily.  Depression.     - Continue Prozac 20 mg po daily.  Anxiety.     - Continue vistaril 50 mg po Q 6 hours prn.  Insomnia.     - Continue Trazodone  50 mg po Q bedtime prn, may repeat x 1.  Discontinued the 1:1 supervision for  safety.  Other medical issues.     - Continue Metformin 500 mg po daily for diabetes.     - Continue Cozaar 100 mg po daily for HTN.     - Continue Lopid 600 mg po bid for high lipid.  Patient continue to attend group sessions.  Discharge disposition plan ongoing.  Armandina Stammer, NP, PMHNP, FNP-BC 06/03/2018, 1:45 PMPatient ID: [name], male   DOB: [birth year], 62 y.o.   MRN: [id number]

## 2018-06-03 NOTE — Progress Notes (Signed)
[facility] Post 1:1 Observation Documentation   Time 1:1 discontinued:  09:30   Patient's Behavior:  Appropriate.  Patient's Condition:  No distress.  Patient's Conversation:  Appropriate.  Fernando Wilkinson  Fernando Wilkinson 06/03/2018, 9:35 AM

## 2018-06-03 NOTE — Progress Notes (Signed)
Patient did attend the evening speaker AA meeting.  

## 2018-06-03 NOTE — Progress Notes (Signed)
  D: Pt passive SI-contract for safety. denies HI/AVH. Pt is pleasant and cooperative. Pt presents depressed , but stated he was better due to the medications. Pt did endorse sleep issues .  A: Pt was offered support and encouragement. Pt was given scheduled medications. Pt was encourage to attend groups. Q 15 minute checks were done for safety.  R:Pt attends groups and interacts well with peers and staff. Pt is taking medication. Pt has no complaints.Pt receptive to treatment and safety maintained on unit.  Problem: Education: Goal: Emotional status will improve Outcome: Progressing   Problem: Education: Goal: Mental status will improve Outcome: Progressing   Problem: Activity: Goal: Interest or engagement in activities will improve Outcome: Progressing

## 2018-06-03 NOTE — Plan of Care (Signed)
  Problem: Education: Goal: Emotional status will improve Outcome: Progressing   Problem: Education: Goal: Mental status will improve Outcome: Progressing   Problem: Education: Goal: Verbalization of understanding the information provided will improve Outcome: Progressing   Problem: Activity: Goal: Interest or engagement in activities will improve Outcome: Progressing   

## 2018-06-03 NOTE — Progress Notes (Signed)
1:1 obs note -  Patient is resting in bed. NAD. RR WNL, even and unlabored.   Level I obs in place at this time.  Remains safe.

## 2018-06-03 NOTE — Progress Notes (Addendum)
[facility] Post 1:1 Observation Documentation   Time 1:1 discontinued:  09:30   Patient's Behavior:  Appropriate.  Patient's Condition:  No distress.  Patient's Conversation:  Appropriate.  Fernando Wilkinson 06/03/2018, 1:30 PM

## 2018-06-03 NOTE — Progress Notes (Signed)
1:1 obs note -   Patient observed resting on right side. RR WNL, even and unlabored. NAD, no complaints voiced.  Level I obs in place as patient is a high fall risk, hx of fall this admit.  Patient remains safe at present.

## 2018-06-04 DIAGNOSIS — R45851 Suicidal ideations: Secondary | ICD-10-CM

## 2018-06-04 NOTE — [facility] Group Notes (Signed)
[facility] Group Notes:  (Nursing/MHT/Case Management/Adjunct)  Date:  06/04/2018  Time:  9:23 AM  Type of Therapy:  Goals/Orientation Group.  Participation Level:  Active  Participation Quality:  Appropriate  Affect:  Appropriate  Cognitive:  Appropriate  Insight:  Appropriate  Engagement in Group:  Engaged  Modes of Intervention:  Discussion  Summary of Progress/Problems: Pt attended goals/orientation group, pt was receptive.   Deniss Wormley Shanta 06/04/2018, 9:23 AM 

## 2018-06-04 NOTE — [facility] Group Notes (Addendum)
[facility] Group Notes:  (Nursing/MHT/Case Management/Adjunct)  Date:  06/04/2018  Time:  12:41 PM  Type of Therapy:  Psychoeducational Skills  Participation Level:  Did Not Attend  Participation Quality:  DID NOT ATTEND  Affect:  DID NOT ATTEND  Cognitive:  DID NOT ATTEND  Insight:  None  Engagement in Group:  DID NOT ATTEND  Modes of Intervention:  DID NOT ATTEND  Summary of Progress/Problems: Pt did not attend patient self inventory group.   Bethann PunchesJane O Shareena Nusz 06/04/2018, 12:41 PM

## 2018-06-04 NOTE — Progress Notes (Signed)
  D: Pt denies SI/HI/AVH. Pt is pleasant and cooperative. Pt presents very depressed and worries about his situation. Pt stated he was feeling tired and was dealing with anger issues. Pt continues to worry about his job, life and being here.   A: Pt was offered support and encouragement. Pt was given scheduled medications. Pt was encourage to attend groups. Q 15 minute checks were done for safety.   R:Pt attends groups and interacts well with peers and staff. Pt is taking medication. Pt has no complaints.Pt receptive to treatment and safety maintained on unit.  Problem: Education: Goal: Emotional status will improve Outcome: Progressing   Problem: Education: Goal: Mental status will improve Outcome: Progressing   Problem: Physical Regulation: Goal: Ability to maintain clinical measurements within normal limits will improve Outcome: Progressing   Problem: Safety: Goal: Periods of time without injury will increase Outcome: Progressing

## 2018-06-04 NOTE — Progress Notes (Signed)
[facility]  06/04/2018 3:32 PM Fernando Ableimothy Meyer  MRN:  [id number]  Subjective: Fernando Wilkinson reports, "I'm tired, bored and I want to get out of here" Pt slow to respond.  Denies suicidal ideation but has homicidal ideation states "no one that is here though"  Fernando Wilkinson "Fernando Loaim" Langston Wilkinson is a 62 y/o M with history of MDD and alcohol use disorder who was admitted voluntarily from [facility] ED where he presented with worsening depression, SI with plan to hang himself, and worsening use of alcohol in the context of medication non-adherence for the past 6 months. Pt was medically cleared and then transferred to [facility] for additional treatment and stabilization. Upon initial evaluation, pt shares, "I've been feeling depressed - feeling like everything's gonna mess up." Pt cites stressors for feeling unsatisfied at work and having financial stressors. He also has limited social support. He identifies anxiety as his biggest concern and biggest factor related to alcohol use. He endorses depressive symptoms of depressed mood, poor sleep with initial insomnia, anhedonia, guilty feelings, low energy, poor concentration, poor appetite, and suicidal ideation.  Today, Fernando Wilkinson is seen, chart reviewed. The chart findings discussed with the treatment team. He says he is doing some better. He states he is sleeping a lot and wants to leave.  He is visible on the unit, attending group sessions. He presents today future oriented. He is thinking a head about getting discharged.He denies any pain or discomforts Fernando Wilkinson says he is taking & tolerating his medication regimen. He denies any SI.  Denies delusional thoughts or paranoia. He has a blunted affect  His thought process is organized & logical. He has agreed to continue current plan of care already in progress. No changes made.  Principal Problem: Severe recurrent major depression without psychotic features (HCC)  Diagnosis:   Patient Active Problem List   Diagnosis Date Noted   . Alcohol use disorder, severe, dependence (HCC) [F10.20] 05/31/2018    Priority: High  . Severe recurrent major depression without psychotic features (HCC) [F33.2] 05/30/2018    Priority: High   Total Time spent with patient: 15 minutes  Past Psychiatric History: See H&P.Marland Kitchen.  Past Medical History:  Past Medical History:  Diagnosis Date  . Diabetes mellitus without complication (HCC)   . Hypertension    History reviewed. No pertinent surgical history.  Family History: History reviewed. No pertinent family history.  Family Psychiatric  History: See H&P.  Social History:  Social History   Substance and Sexual Activity  Alcohol Use Yes   Comment: daily use     Social History   Substance and Sexual Activity  Drug Use Not Currently    Social History   Socioeconomic History  . Marital status: Single    Spouse name: Not on file  . Number of children: Not on file  . Years of education: Not on file  . Highest education level: Not on file  Occupational History  . Not on file  Social Needs  . Financial resource strain: Not on file  . Food insecurity:    Worry: Not on file    Inability: Not on file  . Transportation needs:    Medical: Not on file    Non-medical: Not on file  Tobacco Use  . Smoking status: Current Every Day Smoker    Types: Cigarettes  . Smokeless tobacco: Never Used  Substance and Sexual Activity  . Alcohol use: Yes    Comment: daily use  . Drug use: Not Currently  . Sexual  activity: Not Currently  Lifestyle  . Physical activity:    Days per week: Not on file    Minutes per session: Not on file  . Stress: Not on file  Relationships  . Social connections:    Talks on phone: Not on file    Gets together: Not on file    Attends religious service: Not on file    Active member of club or organization: Not on file    Attends meetings of clubs or organizations: Not on file    Relationship status: Not on file  Other Topics Concern  . Not on file   Social History Narrative  . Not on file   Additional Social History:    Pain Medications: See MAR Prescriptions: See MAR Over the Counter: See MAR History of alcohol / drug use?: Yes Longest period of sobriety (when/how long): 6 years Negative Consequences of Use: Legal Name of Substance 1: alcohol 1 - Age of First Use: 19 1 - Amount (size/oz): pint of liquor 1 - Frequency: 3-4 nights a week 1 - Duration: unknown 1 - Last Use / Amount: 05/29/2018  Sleep: Good  Appetite:  Fair  Current Medications: Current Facility-Administered Medications  Medication Dose Route Frequency Provider Last Rate Last Dose  . acetaminophen (TYLENOL) tablet 650 mg  650 mg Oral Q6H PRN [name], NP      . alum & mag hydroxide-simeth (MAALOX/MYLANTA) 200-200-20 MG/5ML suspension 30 mL  30 mL Oral Q4H PRN [name], NP      . ARIPiprazole (ABILIFY) tablet 5 mg  5 mg Oral Daily [name], NP   5 mg at 06/04/18 0816  . FLUoxetine (PROZAC) capsule 20 mg  20 mg Oral Daily [name], NP   20 mg at 06/04/18 0816  . gemfibrozil (LOPID) tablet 600 mg  600 mg Oral BID [name], NP   600 mg at 06/04/18 0816  . hydrOXYzine (ATARAX/VISTARIL) tablet 50 mg  50 mg Oral Q6H PRN [name], MD   50 mg at 06/04/18 0818  . losartan (COZAAR) tablet 100 mg  100 mg Oral Daily [name], NP   100 mg at 06/04/18 0816  . magnesium hydroxide (MILK OF MAGNESIA) suspension 30 mL  30 mL Oral Daily PRN [name], NP      . metFORMIN (GLUCOPHAGE-XR) 24 hr tablet 500 mg  500 mg Oral Daily Nwoko, Agnes I, NP   500 mg at 06/04/18 0816  . multivitamin with minerals tablet 1 tablet  1 tablet Oral Daily [name], NP   1 tablet at 06/04/18 0819  . nicotine (NICODERM CQ - dosed in mg/24 hours) patch 21 mg  21 mg Transdermal Daily Cobos, [name], MD   21 mg at 06/04/18 0820  . thiamine (VITAMIN B-1) tablet 100 mg  100 mg Oral Daily [name], NP   100 mg at 06/04/18 0816  .  traZODone (DESYREL) tablet 50 mg  50 mg Oral QHS PRN,MR X 1 [name], MD   50 mg at 06/03/18 2205   Lab Results:  No results found for this or any previous visit (from the past 48 hour(s)). Blood Alcohol level:  No results found for: [facility]  Metabolic Disorder Labs: Lab Results  Component Value Date   HGBA1C 5.7 (H) 05/31/2018   MPG 117 05/31/2018   No results found for: PROLACTIN Lab Results  Component Value Date   CHOL 197 05/31/2018  TRIG 203 (H) 05/31/2018   HDL 41 05/31/2018   CHOLHDL 4.8 05/31/2018   VLDL 41 (H) 05/31/2018   LDLCALC 115 (H) 05/31/2018   Physical Findings: AIMS: Facial and Oral Movements Muscles of Facial Expression: None, normal Lips and Perioral Area: None, normal Jaw: None, normal Tongue: None, normal,Extremity Movements Upper (arms, wrists, hands, fingers): None, normal Lower (legs, knees, ankles, toes): None, normal, Trunk Movements Neck, shoulders, hips: None, normal, Overall Severity Severity of abnormal movements (highest score from questions above): None, normal Incapacitation due to abnormal movements: None, normal Patient's awareness of abnormal movements (rate only patient's report): No Awareness, Dental Status Current problems with teeth and/or dentures?: No Does patient usually wear dentures?: No  CIWA:  CIWA-Ar Total: 1 COWS:     Musculoskeletal: Strength & Muscle Tone: within normal limits Gait & Station: normal Patient leans: N/A  Psychiatric Specialty Exam: Physical Exam  Nursing note and vitals reviewed. Constitutional: He is oriented to person, place, and time. He appears well-developed and well-nourished.  HENT:  Head: Normocephalic.  Neck: Normal range of motion.  Respiratory: Effort normal.  Musculoskeletal: Normal range of motion.  Neurological: He is alert and oriented to person, place, and time.  Psychiatric: His speech is normal and behavior is normal. Judgment and thought content normal. His mood  appears anxious. Cognition and memory are normal. He exhibits a depressed mood.    Review of Systems  Psychiatric/Behavioral: Positive for depression and substance abuse (Hx. alcoholism). Negative for hallucinations, memory loss and suicidal ideas. The patient has insomnia. The patient is not nervous/anxious.   All other systems reviewed and are negative.   Blood pressure 111/72, pulse 74, temperature 98.5 F (36.9 C), temperature source Oral, resp. rate (!) 24, height 5\' 11"  (1.803 m), weight 108.4 kg, SpO2 98 %.Body mass index is 33.33 kg/m.  General Appearance: Casual and Disheveled  Eye Contact:  limited  Speech:  Clear and Coherent and Normal Rate  Volume:  Normal  Mood:  Anxious and Depressed  Affect:  Appropriate, Congruent and Constricted  Thought Process:  Coherent and Goal Directed  Orientation:  Full (Time, Place, and Person)  Thought Content:  Logical  Suicidal Thoughts:  Yes.  with intent/plan  Homicidal Thoughts:  No  Memory:  Immediate;   Fair Recent;   Fair Remote;   Fair  Judgement:  Poor  Insight:  Lacking  Psychomotor Activity:  Normal  Concentration:  Concentration: Fair  Recall:  [employer] of Knowledge:  Fair  Language:  Fair  Akathisia:  No  Handed:    AIMS (if indicated):     Assets:  Communication Skills Desire for Improvement Housing Physical Health Resilience  ADL's:  Intact  Cognition:  WNL  Sleep: 5.25       Treatment Plan Summary: Daily contact with patient to assess and evaluate symptoms and progress in treatment.  - Continue inpatient hospitalization.  - Will continue today 06/04/2018 plan as below except where it is noted.  Alcohol withdrawal.     - Continue Lorazepam 1 mg Q 6 hours prn for CIWA >10.  Mood control.     - Continue Abilify 5 mg po daily.  Major depressive disorder, recurrent, severe     - Continue Prozac 20 mg po daily.  Anxiety.     - Continue vistaril 50 mg po Q 6 hours prn.  Insomnia.     - Continue  Trazodone 50 mg po Q bedtime prn, may repeat x 1.  Other medical issues.     -  Continue Metformin 500 mg po daily for diabetes.     - Continue Cozaar 100 mg po daily for HTN.     - Continue Lopid 600 mg po bid for high lipid.  Patient continue to attend group sessions.  Discharge disposition plan ongoing.  Nanine Means, NP, PMHNP, FNP-BC 06/04/2018, 3:32 PMPatient ID: Fernando Wilkinson, male   DOB: 29-Apr-1956, 62 y.o.   MRN: [id number] Patient ID: Tylee Newby, male   DOB: 10-07-1955, 62 y.o.   MRN: [id number]

## 2018-06-04 NOTE — [facility] Group Notes (Signed)
[facility] Group Notes:  (Nursing)  Date:  06/04/2018  Time: 1:15 Pm Type of Therapy:  Nurse Education  Participation Level:  Active  Participation Quality:  Appropriate  Affect:  Appropriate  Cognitive:  Appropriate  Insight:  Appropriate  Engagement in Group:  Engaged  Modes of Intervention:  Discussion and Education  Summary of Progress/Problems: Nurse -led group discussing anger management  Shela NevinValerie S Azariyah Luhrs 06/04/2018, 3:30 PM

## 2018-06-04 NOTE — [facility] Group Notes (Signed)
LCSW Group Therapy Note  06/04/2018   10:00-11:00am   Type of Therapy and Topic:  Group Therapy: Anger Cues and Responses  Participation Level:  Minimal   Description of Group:   In this group, patients learned how to recognize the physical, cognitive, emotional, and behavioral responses they have to anger-provoking situations.  They identified a recent time they became angry and how they reacted.  They analyzed how their reaction was possibly beneficial and how it was possibly unhelpful.  The group discussed a variety of healthier coping skills that could help with such a situation in the future.  Deep breathing was practiced briefly.  Therapeutic Goals: 1. Patients will remember their last incident of anger and how they felt emotionally and physically, what their thoughts were at the time, and how they behaved. 2. Patients will identify how their behavior at that time worked for them, as well as how it worked against them. 3. Patients will explore possible new behaviors to use in future anger situations. 4. Patients will learn that anger itself is normal and cannot be eliminated, and that healthier reactions can assist with resolving conflict rather than worsening situations.  Summary of Patient Progress:  The patient shared that he experienced anger when thinking about things the past, specifically, lost opportunities. In his life he recognizes that unresolved anger has fed his addiction and self-destructive behaviors. Patient recognizes that anger is natural and there are physical and emotional responses that come with anger. He expressed intent to use prayer as coping strategy moving forward.  Therapeutic Modalities:   Cognitive Behavioral Therapy  Evorn Gongonnie D Yaslyn Cumby

## 2018-06-04 NOTE — Progress Notes (Signed)
D. Pt presents with an anxious affect and depressed mood- friendly during interactions- calm and cooperative behavior. Pt observed attending groups and interacting appropriately with peers in the milieu.Pt currently denies SI/HI and AVH and agrees to contact staff before acting on any harmful thoughts.  A. Labs and vitals monitored. Pt compliant with medications. Pt supported emotionally and encouraged to express concerns and ask questions.   R. Pt remains safe with 15 minute checks. Will continue POC.

## 2018-06-05 NOTE — [facility] Counselor (Signed)
Clinical Social Work Note - late entry for 06/04/18  At pt request, met with him individually to discuss his discharge plan.  He complained repeatedly that he does not know what is going on in his care, does not know his medications, and has no idea when he will leave the hospital.  Discharge plan was discussed with him, as currently in his chart, and he explained that he really cannot drive as far as [place] to that [facility] office, and would prefer to go to the [place] office.  This was relayed to his weekday CSW via hand-off.    Pt kept saying he wants to go home, and was encouraged to share this tomorrow with his provider.  He abruptly changed this statement at one point, and started saying "I don't know if I'm ready."  CSW pointed out the discrepancy and informed him that may be one reason he is still here, because his providers have not yet determined he really is as well as he needs to be to leave the hospital.  He calmed down somewhat.  As we talked, he seemed to understand little even when information was repeated.  Selmer Dominion, LCSW 06/05/2018, 8:05 AM

## 2018-06-05 NOTE — [facility] Group Notes (Signed)
Adult Psychoeducational Group Note  Date:  06/05/2018 Time:  9:35 AM  Group Topic/Focus:  Goals Group:   The focus of this group is to help patients establish daily goals to achieve during treatment and discuss how the patient can incorporate goal setting into their daily lives to aide in recovery.  Participation Level:  Active  Participation Quality:  Appropriate  Affect:  Appropriate  Cognitive:  Alert  Insight: Appropriate  Engagement in Group:  Engaged  Modes of Intervention:  Orientation  Additional Comments: Pt attended orientation/goals group.  Dellia NimsJaquesha M Carrel Leather 06/05/2018, 9:35 AM

## 2018-06-05 NOTE — Progress Notes (Signed)
[facility] MD Progress Note  06/05/2018 3:07 PM [name]  MRN:  [id number]  Subjective: Fernando Wilkinson reports, "I'm doing better, just bored. I think I'm ready to go home. I want my prescriptions for all my medicines. You think I will get those?   Fernando Wilkinson is a 62 y/o M with history of MDD and alcohol use disorder who was admitted voluntarily from [facility] ED where he presented with worsening depression, SI with plan to hang himself, and worsening use of alcohol in the context of medication non-adherence for the past 6 months. Pt was medically cleared and then transferred to [facility] for additional treatment and stabilization. Upon initial evaluation, pt shares, "I've been feeling depressed - feeling like everything's gonna mess up." Pt cites stressors for feeling unsatisfied at work and having financial stressors. He also has limited social support. He identifies anxiety as his biggest concern and biggest factor related to alcohol use. He endorses depressive symptoms of depressed mood, poor sleep with initial insomnia, anhedonia, guilty feelings, low energy, poor concentration, poor appetite, and suicidal ideation.  Today, Wells is seen, chart reviewed. The chart findings discussed with the treatment team. He says he is doing well, just bored. He is visible on the unit, attending group sessions. He says he is sleeping well. He is feeling a lot stronger & no longer at risks for fall. He presents today future oriented. He is thinking a head about getting discharged, arranging for his ride & where to follow-up care after discharge. He is also requesting for prescriptions of all his medicines. He denies any pain or discomforts Shaine says he is taking & tolerating his medication regimen. He denies any SIHI, AVH, delusional thoughts or paranoia. His thought process is more organized & logical. He has agreed to continue current plan of care already in progress. No changes made.  Principal Problem: Severe  recurrent major depression without psychotic features (HCC)  Diagnosis:   Patient Active Problem List   Diagnosis Date Noted  . Severe recurrent major depression without psychotic features (HCC) [F33.2] 05/30/2018    Priority: High  . Alcohol use disorder, severe, dependence (HCC) [F10.20] 05/31/2018   Total Time spent with patient: 15 minutes  Past Psychiatric History: See H&P.Marland Kitchen  Past Medical History:  Past Medical History:  Diagnosis Date  . Diabetes mellitus without complication (HCC)   . Hypertension    History reviewed. No pertinent surgical history.  Family History: History reviewed. No pertinent family history.  Family Psychiatric  History: See H&P.  Social History:  Social History   Substance and Sexual Activity  Alcohol Use Yes   Comment: daily use     Social History   Substance and Sexual Activity  Drug Use Not Currently    Social History   Socioeconomic History  . Marital status: Single    Spouse name: Not on file  . Number of children: Not on file  . Years of education: Not on file  . Highest education level: Not on file  Occupational History  . Not on file  Social Needs  . Financial resource strain: Not on file  . Food insecurity:    Worry: Not on file    Inability: Not on file  . Transportation needs:    Medical: Not on file    Non-medical: Not on file  Tobacco Use  . Smoking status: Current Every Day Smoker    Types: Cigarettes  . Smokeless tobacco: Never Used  Substance and Sexual Activity  . Alcohol use:  Yes    Comment: daily use  . Drug use: Not Currently  . Sexual activity: Not Currently  Lifestyle  . Physical activity:    Days per week: Not on file    Minutes per session: Not on file  . Stress: Not on file  Relationships  . Social connections:    Talks on phone: Not on file    Gets together: Not on file    Attends religious service: Not on file    Active member of club or organization: Not on file    Attends meetings of  clubs or organizations: Not on file    Relationship status: Not on file  Other Topics Concern  . Not on file  Social History Narrative  . Not on file   Additional Social History:    Pain Medications: See MAR Prescriptions: See MAR Over the Counter: See MAR History of alcohol / drug use?: Yes Longest period of sobriety (when/how long): 6 years Negative Consequences of Use: Legal Name of Substance 1: alcohol 1 - Age of First Use: 19 1 - Amount (size/oz): pint of liquor 1 - Frequency: 3-4 nights a week 1 - Duration: unknown 1 - Last Use / Amount: 05/29/2018  Sleep: Good  Appetite:  Fair  Current Medications: Current Facility-Administered Medications  Medication Dose Route Frequency Provider Last Rate Last Dose  . acetaminophen (TYLENOL) tablet 650 mg  650 mg Oral Q6H PRN [name], NP      . alum & mag hydroxide-simeth (MAALOX/MYLANTA) 200-200-20 MG/5ML suspension 30 mL  30 mL Oral Q4H PRN [name], NP      . ARIPiprazole (ABILIFY) tablet 5 mg  5 mg Oral Daily [name], NP   5 mg at 06/05/18 0804  . FLUoxetine (PROZAC) capsule 20 mg  20 mg Oral Daily [name], NP   20 mg at 06/05/18 0804  . gemfibrozil (LOPID) tablet 600 mg  600 mg Oral BID [name], NP   600 mg at 06/05/18 0804  . hydrOXYzine (ATARAX/VISTARIL) tablet 50 mg  50 mg Oral Q6H PRN [name], MD   50 mg at 06/05/18 1120  . losartan (COZAAR) tablet 100 mg  100 mg Oral Daily [name], NP   100 mg at 06/05/18 0804  . magnesium hydroxide (MILK OF MAGNESIA) suspension 30 mL  30 mL Oral Daily PRN [name], NP      . metFORMIN (GLUCOPHAGE-XR) 24 hr tablet 500 mg  500 mg Oral Daily [name], NP   500 mg at 06/05/18 0804  . multivitamin with minerals tablet 1 tablet  1 tablet Oral Daily [name], NP   1 tablet at 06/05/18 0803  . nicotine (NICODERM CQ - dosed in mg/24 hours) patch 21 mg  21 mg Transdermal Daily Cobos, [name], MD   21 mg at 06/05/18 0810  .  thiamine (VITAMIN B-1) tablet 100 mg  100 mg Oral Daily [name], NP   100 mg at 06/05/18 0803  . traZODone (DESYREL) tablet 50 mg  50 mg Oral QHS PRN,MR X 1 [name], MD   50 mg at 06/04/18 2128   Lab Results:  No results found for this or any previous visit (from the past 48 hour(s)). Blood Alcohol level:  No results found for: [facility]  Metabolic Disorder Labs: Lab Results  Component Value Date   HGBA1C 5.7 (H) 05/31/2018   MPG 117 05/31/2018   No  results found for: PROLACTIN Lab Results  Component Value Date   CHOL 197 05/31/2018   TRIG 203 (H) 05/31/2018   HDL 41 05/31/2018   CHOLHDL 4.8 05/31/2018   VLDL 41 (H) 05/31/2018   LDLCALC 115 (H) 05/31/2018   Physical Findings: AIMS: Facial and Oral Movements Muscles of Facial Expression: None, normal Lips and Perioral Area: None, normal Jaw: None, normal Tongue: None, normal,Extremity Movements Upper (arms, wrists, hands, fingers): None, normal Lower (legs, knees, ankles, toes): None, normal, Trunk Movements Neck, shoulders, hips: None, normal, Overall Severity Severity of abnormal movements (highest score from questions above): None, normal Incapacitation due to abnormal movements: None, normal Patient's awareness of abnormal movements (rate only patient's report): No Awareness, Dental Status Current problems with teeth and/or dentures?: No Does patient usually wear dentures?: No  CIWA:  CIWA-Ar Total: 3 COWS:     Musculoskeletal: Strength & Muscle Tone: within normal limits Gait & Station: normal Patient leans: N/A  Psychiatric Specialty Exam: Physical Exam  Nursing note and vitals reviewed.   Review of Systems  Psychiatric/Behavioral: Positive for depression and substance abuse (Hx. alcoholism). Negative for hallucinations, memory loss and suicidal ideas. The patient has insomnia. The patient is not nervous/anxious.     Blood pressure 100/69, pulse 80, temperature 98.5 F (36.9 C), temperature  source Oral, resp. rate (!) 24, height 5\' 11"  (1.803 m), weight 108.4 kg, SpO2 98 %.Body mass index is 33.33 kg/m.  General Appearance: Casual and Disheveled  Eye Contact:  Good  Speech:  Clear and Coherent and Normal Rate  Volume:  Normal  Mood:  Anxious and Depressed  Affect:  Appropriate, Congruent and Constricted  Thought Process:  Coherent and Goal Directed  Orientation:  Full (Time, Place, and Person)  Thought Content:  Logical  Suicidal Thoughts:  Yes.  with intent/plan  Homicidal Thoughts:  No  Memory:  Immediate;   Fair Recent;   Fair Remote;   Fair  Judgement:  Poor  Insight:  Lacking  Psychomotor Activity:  Normal  Concentration:  Concentration: Fair  Recall:  [employer]Fair  Fund of Knowledge:  Fair  Language:  Fair  Akathisia:  No  Handed:    AIMS (if indicated):     Assets:  Communication Skills Desire for Improvement Housing Physical Health Resilience  ADL's:  Intact  Cognition:  WNL  Sleep: 5.25       Treatment Plan Summary: Daily contact with patient to assess and evaluate symptoms and progress in treatment.  - Continue inpatient hospitalization.  - Will continue today 06/05/2018 plan as below except where it is noted.  Alcohol withdrawal.     - Discontinued Lorazepam 1 mg Q 6 hours prn for CIWA >10.  Mood control.     - Continue Abilify 5 mg po daily.  Depression.     - Continue Prozac 20 mg po daily.  Anxiety.     - Continue vistaril 50 mg po Q 6 hours prn.  Insomnia.     - Continue Trazodone 50 mg po Q bedtime prn, may repeat x 1.  Discontinued the 1:1 supervision for safety.  Other medical issues.     - Continue Metformin 500 mg po daily for diabetes.     - Continue Cozaar 100 mg po daily for HTN.     - Continue Lopid 600 mg po bid for high lipid.  Patient continue to attend group sessions.  Discharge disposition plan ongoing.  Armandina StammerAgnes Kush Farabee, NP, PMHNP, FNP-BC 06/05/2018, 3:07 PMPatient ID: Fernando Wilkinson, male  DOB: Aug 29, 1956, 62 y.o.    MRN: [id number]

## 2018-06-05 NOTE — Progress Notes (Signed)
D: Pt denies SI/HI/AVH. Pt is pleasant and cooperative. Pt continues to present on the unit with sad/ depressed affect. Pt reluctant to talk but will answer questions when engaged.  A: Pt was offered support and encouragement. Pt was given scheduled medications. Pt was encourage to attend groups. Q 15 minute checks were done for safety.   R: Pt is taking medication. Pt has no complaints.Pt receptive to treatment and safety maintained on unit.

## 2018-06-05 NOTE — [facility] Group Notes (Signed)
Pt was alert and engaged in therapeutic relaxation group. 

## 2018-06-05 NOTE — Progress Notes (Signed)
Patient ID: Fernando Wilkinson, male   DOB: 10/06/1955, 62 y.o.   MRN: [id number] [facility]:  (Nursing/MHT/Case Management/Adjunct)  Date:  06/05/2018  Time:  3:16 PM  Type of Therapy:  Played a non-competitive learning/communication board game that foster learning skills as well as self expression.  Participation Level:  Attended and participated.  Participation Quality:  Good  Affect: WNL  Cognitive:  WNL  Insight:  WNL  Engagement in Group:  WNL  Modes of Intervention:  WNL  Summary of Progress/Problems: Pt participated appropriately during group activity.  Fernando Wilkinson 06/05/2018, 3:16 PM

## 2018-06-05 NOTE — Progress Notes (Signed)
D. Pt presents with a sad affect- depressed mood- but improving- observed smiling at times during interactions. Per pt's self inventory, pt rates his depression, hopelessness and anxiety, a 5/5/4, respectively. Pt writes that his most important goal today is "be more relaxed" and writes that he will use "meditation" to help him achieve his goal. Pt currently denies SI/HI and AVH and agrees to contact staff before acting on any harmful thoughts.  A. Labs and vitals monitored. Pt compliant with medications. Pt supported emotionally and encouraged to express concerns and ask questions.   R. Pt remains safe with 15 minute checks. Will continue POC.

## 2018-06-05 NOTE — [facility] Group Notes (Signed)
[facility] LCSW Group Therapy Note  Date/Time:  06/05/2018 9:00-10:00 or 10:00-11:00AM  Type of Therapy and Topic:  Group Therapy:  Healthy and Unhealthy Supports  Participation Level:  Active   Description of Group:  Patients in this group were introduced to the idea of adding a variety of healthy supports to address the various needs in their lives.Patients discussed what additional healthy supports could be helpful in their recovery and wellness after discharge in order to prevent future hospitalizations.   An emphasis was placed on using counselor, doctor, therapy groups, 12-step groups, and problem-specific support groups to expand supports.  They also worked as a group on developing a specific plan for several patients to deal with unhealthy supports through boundary-setting, psychoeducation with loved ones, and even termination of relationships.   Therapeutic Goals:   1)  discuss importance of adding supports to stay well once out of the hospital  2)  compare healthy versus unhealthy supports and identify some examples of each  3)  generate ideas and descriptions of healthy supports that can be added  4)  offer mutual support about how to address unhealthy supports  5)  encourage active participation in and adherence to discharge plan    Summary of Patient Progress:  The patient stated that are no healthy supports currently in life and recognizes that he may have to establish a support system by incorporating 12-steps groups, sponsors and professional helpers.  The patient expressed a willingness to add these types as supports to help in his recovery journey.   Therapeutic Modalities:   Motivational Interviewing Brief Solution-Focused Therapy  Evorn Gongonnie D Dontarious Schaum

## 2018-06-06 MED ORDER — HYDROXYZINE HCL 50 MG PO TABS: 50.0000 mg | ORAL_TABLET | Freq: Four times a day (QID) | ORAL | 0 refills | Status: AC | PRN

## 2018-06-06 MED ORDER — FLUOXETINE HCL 20 MG PO CAPS: 20.0000 mg | ORAL_CAPSULE | Freq: Every day | ORAL | 0 refills | Status: AC

## 2018-06-06 MED ORDER — TRAZODONE HCL 50 MG PO TABS: 50.0000 mg | ORAL_TABLET | Freq: Every evening | ORAL | 0 refills | Status: AC | PRN

## 2018-06-06 MED ORDER — ARIPIPRAZOLE 5 MG PO TABS: 5.0000 mg | ORAL_TABLET | Freq: Every day | ORAL | 0 refills | Status: AC

## 2018-06-06 NOTE — [facility] Suicide Risk Assessment (Signed)
[facility]   Principal Problem: Severe recurrent major depression without psychotic features [facility](HCC) Discharge Diagnoses:  Patient Active Problem List   Diagnosis Date Noted  . Alcohol use disorder, severe, dependence (HCC) [F10.20] 05/31/2018  . Severe recurrent major depression without psychotic features (HCC) [F33.2] 05/30/2018    Total Time spent with patient: 30 minutes  Musculoskeletal: Strength & Muscle Tone: within normal limits Gait & Station: normal Patient leans: N/A  Psychiatric Specialty Exam: Review of Systems  Constitutional: Negative for chills and fever.  Respiratory: Negative for cough and shortness of breath.   Cardiovascular: Negative for chest pain.  Gastrointestinal: Negative for abdominal pain, heartburn, nausea and vomiting.  Psychiatric/Behavioral: Negative for depression, hallucinations and suicidal ideas. The patient is not nervous/anxious and does not have insomnia.     Blood pressure 91/67, pulse 99, temperature 98.5 F (36.9 C), temperature source Oral, resp. rate (!) 24, height 5\' 11"  (1.803 m), weight 108.4 kg, SpO2 98 %.Body mass index is 33.33 kg/m.  General Appearance: Casual and Fairly Groomed  Patent attorneyye Contact::  Good  Speech:  Clear and Coherent and Normal Rate  Volume:  Normal  Mood:  Euthymic  Affect:  Appropriate and Congruent  Thought Process:  Coherent and Goal Directed  Orientation:  Full (Time, Place, and Person)  Thought Content:  Logical  Suicidal Thoughts:  No  Homicidal Thoughts:  No  Memory:  Immediate;   Good Recent;   Good Remote;   Good  Judgement:  Fair  Insight:  Lacking  Psychomotor Activity:  Normal  Concentration:  Good  Recall:  Good  Fund of Knowledge:Good  Language: Good  Akathisia:  No  Handed:    AIMS (if indicated):     Assets:  Resilience Social Support  Sleep:  Number of Hours: 4.5  Cognition: WNL  ADL's:  Intact   Mental Status Per Nursing Assessment::   On Admission:   Suicidal ideation indicated by patient, Self-harm thoughts  Demographic Factors:  Male and Caucasian  Loss Factors: Financial problems/change in socioeconomic status  Historical Factors: Impulsivity  Risk Reduction Factors:   Positive social support, Positive therapeutic relationship and Positive coping skills or problem solving skills  Continued Clinical Symptoms:  Severe Anxiety and/or Agitation Depression:   Impulsivity Alcohol/Substance Abuse/Dependencies  Cognitive Features That Contribute To Risk:  None    Suicide Risk:  Minimal: No identifiable suicidal ideation.  Patients presenting with no risk factors but with morbid ruminations; may be classified as minimal risk based on the severity of the depressive symptoms  Follow-up Information    [facility] Follow up on 06/07/2018.   Why:  Hospital follow-up on Tuesday, 9/17 at 12:15PM. Please bring the following if you have it, being that you will be a new client: Photo ID, social security card, any proof of household income, and hospital discharge paperwork. Thank you.  Contact information: [address] [place] [address] [phone]         Subjective Data:  Marcial Pacasimothy "Jorja Loaim" Langston MaskerMorris is a 62 y/o M with history of MDD and alcohol use disorder who was admitted voluntarily from [facility] where he presented with worsening depression, SI with plan to hang himself, and worsening use of alcohol in the context of medication non-adherence for the past 6 months. Pt [facility] medically cleared [facility]. He was started on trial of prozac and abilify. He reported gradual improvement of his presenting symptoms.   Today  upon evaluation, pt shares, "I'm doing a whole lot better, I guess." Pt reports some ongoing depression, but he reports complete resolution of "thoughts of doom." He denies SI/HI/AH/VH. He is sleeping fairly, which he associates with his roommate  keeping the temperature too low in the room. He denies other physical complaints. He feels that his medications have been helpful. He is in agreement to continue his current regimen without changes. He was able to engage in safety planning including plan to return to [facility] or contact emergency services if he feels unable to maintain his own safety or the safety of others. Pt had no further questions, comments, or concerns.   Plan Of Care/Follow-up recommendations:   -Discharge to outpatient level of care  -MDD, recurrent, severe, without psychosis             -Continue prozac 20mg  po qDay             -Continue abilify 5mg  po qDay  -DMII             -continue gemfibrozil 600mg  po BID             -Continue metformin 500mg  po qDay  -HTN              -Continue losartan 100mg  po qDay  -insomnia             -Continue trazodone 50mg  po qhs prn insomnia  -anxiety             -Continue vistaril 50mg  po q6h prn anxiety  Activity:  as tolerated Diet:  normal Tests:  NA Other:  see above for DC plan  [name], MD 06/06/2018, 10:59 AM

## 2018-06-06 NOTE — Discharge Summary (Addendum)
Physician Discharge Summary Note  Patient:  Fernando Wilkinson is an 62 y.o., male MRN:  [id number] DOB:  04/08/1956 Patient phone:  [phone] (home)  Patient [place]   [address] [place] [id number],  Total Time spent with patient: 20 minutes  Date of Admission:  05/30/2018 Date of Discharge: 06/06/18  Reason for Admission:  Worsening depression with SI  Principal Problem: Severe recurrent major depression without psychotic features [facility](HCC) Discharge Diagnoses: Patient Active Problem List   Diagnosis Date Noted  . Alcohol use disorder, severe, dependence (HCC) [F10.20] 05/31/2018  . Severe recurrent major depression without psychotic features (HCC) [F33.2] 05/30/2018     Past Medical History:  Past Medical History:  Diagnosis Date  . Diabetes mellitus without complication (HCC)   . Hypertension    History reviewed. No pertinent surgical history. Family History: History reviewed. No pertinent family history. Family Psychiatric  History: Major depression: Mother.                                                      Suicide attempt: Maternal cousin Social History:  Social History   Substance and Sexual Activity  Alcohol Use Yes   Comment: daily use     Social History   Substance and Sexual Activity  Drug Use Not Currently    Social History   Socioeconomic History  . Marital status: Single    Spouse name: Not on file  . Number of children: Not on file  . Years of education: Not on file  . Highest education level: Not on file  Occupational History  . Not on file  Social Needs  . Financial resource strain: Not on file  . Food insecurity:    Worry: Not on file    Inability: Not on file  . Transportation needs:    Medical: Not on file    Non-medical: Not on file  Tobacco Use  . Smoking status: Current Every Day Smoker    Types: Cigarettes  . Smokeless tobacco: Never Used  Substance and Sexual Activity  . Alcohol use: Yes    Comment: daily use  . Drug use: Not  Currently  . Sexual activity: Not Currently  Lifestyle  . Physical activity:    Days per week: Not on file    Minutes per session: Not on file  . Stress: Not on file  Relationships  . Social connections:    Talks on phone: Not on file    Gets together: Not on file    Attends religious service: Not on file    Active member of club or organization: Not on file    Attends meetings of clubs or organizations: Not on file    Relationship status: Not on file  Other Topics Concern  . Not on file  Social History Narrative  . Not on file    Hospital Course:   05/[facility] Assessment: 62 year old Caucasian male with hx of depression, anxiety & alcohol use disorders. [facility] with complaints of worsening symptoms of depression, suicidal ideations with plans to hang himself, increased alcohol consumption & lack of support system. He does have previous hx of mental illness & have had inpatient psychiatric hospitalizations 20 years. He is admitted for mood stabilization treatment as he reported his Celexa had  stopped working. During this assessment, Fernando Wilkinson reports, "I went to the [facility] yesterday morning. I felt like I needed some kind of help. My nerves are on the edge. I am feeling afraid & anxious all the time. I have been in this kind of situation in 1997, or was it 1998, oh, it was in the 1990s. At the time, I was hospitalized at [facility]. I was depressed & anxious just like I am now. The suicidal thoughts started 3 weeks ago. I thought about hanging myself after I saw a rope on my porch. I have never attempted suicide, but my maternal cousin did. He tried to drown himself, but he did not succeed. I have been on medication for depression. The most recent was (Citalopram). I took it last about 6 months ago. I did not think it was working. I have been drinking a lot to deal with this anxiety. A pint of liquor a day x 2 weeks. No  withdrawal symptoms".  Patient remained on the [facility] unit for 6 days. The patient stabilized on medication and therapy. Patient was discharged on Prozac 20 mg Daily, Abilify 5 mg Daily, Vistaril 50 mg Q6H PRN, Trazodne 50 mg QHS PRN. Patient has shown improvement with improved mood, affect, sleep, appetite, and interaction. Patient has attended group and participated. Patient has been seen in the day room interacting with peers and staff appropriately. Patient denies any SI/HI/AVH and contracts for safety. Patient agrees to follow up at [facility]. Patient is provided with prescriptions for their medications upon discharge.   Physical Findings: AIMS: Facial and Oral Movements Muscles of Facial Expression: None, normal Lips and Perioral Area: None, normal Jaw: None, normal Tongue: None, normal,Extremity Movements Upper (arms, wrists, hands, fingers): None, normal Lower (legs, knees, ankles, toes): None, normal, Trunk Movements Neck, shoulders, hips: None, normal, Overall Severity Severity of abnormal movements (highest score from questions above): None, normal Incapacitation due to abnormal movements: None, normal Patient's awareness of abnormal movements (rate only patient's report): No Awareness, Dental Status Current problems with teeth and/or dentures?: No Does patient usually wear dentures?: No  CIWA:  CIWA-Ar Total: 3 COWS:     Musculoskeletal: Strength & Muscle Tone: within normal limits Gait & Station: normal Patient leans: N/A  Psychiatric Specialty Exam: Physical Exam  Nursing note and vitals reviewed. Constitutional: He is oriented to person, place, and time. He appears well-developed and well-nourished.  Cardiovascular: Normal rate.  Respiratory: Effort normal.  Musculoskeletal: Normal range of motion.  Neurological: He is alert and oriented to person, place, and time.  Skin: Skin is warm.    Review of Systems  Constitutional: Negative.   HENT: Negative.    Eyes: Negative.   Respiratory: Negative.   Cardiovascular: Negative.   Gastrointestinal: Negative.   Genitourinary: Negative.   Musculoskeletal: Negative.   Skin: Negative.   Neurological: Negative.   Endo/Heme/Allergies: Negative.   Psychiatric/Behavioral: Negative.     Blood pressure 91/67, pulse 99, temperature 98.5 F (36.9 C), temperature source Oral, resp. rate (!) 24, height 5\' 11"  (1.803 m), weight 108.4 kg, SpO2 98 %.Body mass index is 33.33 kg/m.  General Appearance: Casual  Eye Contact:  Good  Speech:  Clear and Coherent and Normal Rate  Volume:  Normal  Mood:  Euthymic  Affect:  Congruent  Thought Process:  Goal Directed and Descriptions of Associations: Intact  Orientation:  Full (Time, Place, and Person)  Thought Content:  WDL  Suicidal Thoughts:  No  Homicidal Thoughts:  No  Memory:  Immediate;   Good Recent;   Good Remote;   Good  Judgement:  Fair  Insight:  Fair  Psychomotor Activity:  Normal  Concentration:  Concentration: Good and Attention Span: Good  Recall:  Good  Fund of Knowledge:  Good  Language:  Good  Akathisia:  No  Handed:  Right  AIMS (if indicated):     Assets:  Communication Skills Desire for Improvement Financial Resources/Insurance Housing Physical Health Social Support Transportation  ADL's:  Intact  Cognition:  WNL  Sleep:  Number of Hours: 4.5     Have you used any form of tobacco in the last 30 days? (Cigarettes, Smokeless Tobacco, Cigars, and/or Pipes): Yes  Has this patient used any form of tobacco in the last 30 days? (Cigarettes, Smokeless Tobacco, Cigars, and/or Pipes) Yes, Yes, A prescription for an FDA-approved tobacco cessation medication was offered at discharge and the patient refused  Blood Alcohol level:  No results found for: [facility]  Metabolic Disorder Labs:  Lab Results  Component Value Date   HGBA1C 5.7 (H) 05/31/2018   MPG 117 05/31/2018   No results found for: PROLACTIN Lab Results  Component Value  Date   CHOL 197 05/31/2018   TRIG 203 (H) 05/31/2018   HDL 41 05/31/2018   CHOLHDL 4.8 05/31/2018   VLDL 41 (H) 05/31/2018   LDLCALC 115 (H) 05/31/2018    See Psychiatric Specialty Exam and Suicide Risk Assessment completed by Attending Physician prior to discharge.  Discharge destination:  Home  Is patient on multiple antipsychotic therapies at discharge:  No   Has Patient had three or more failed trials of antipsychotic monotherapy by history:  No  Recommended Plan for Multiple Antipsychotic Therapies: NA   Allergies as of 06/06/2018      Reactions   Sulfa Antibiotics Itching      Medication List    STOP taking these medications   acetaminophen 500 MG tablet Commonly known as:  TYLENOL     TAKE these medications     Indication  ARIPiprazole 5 MG tablet Commonly known as:  ABILIFY Take 1 tablet (5 mg total) by mouth daily. For mood control Start taking on:  06/07/2018  Indication:  An adjunct to antidepressant.   FLUoxetine 20 MG capsule Commonly known as:  PROZAC Take 1 capsule (20 mg total) by mouth daily. For mood control Start taking on:  06/07/2018  Indication:  Major Depressive Disorder   gemfibrozil 600 MG tablet Commonly known as:  LOPID Take 600 mg by mouth 2 (two) times daily.  Indication:  Per PCP   hydrOXYzine 50 MG tablet Commonly known as:  ATARAX/VISTARIL Take 1 tablet (50 mg total) by mouth every 6 (six) hours as needed for anxiety.  Indication:  Feeling Anxious   losartan 100 MG tablet Commonly known as:  COZAAR Take 100 mg by mouth daily.  Indication:  High Blood Pressure Disorder   metFORMIN 500 MG 24 hr tablet Commonly known as:  GLUCOPHAGE-XR Take 500 mg by mouth daily.  Indication:  Type 2 Diabetes   traZODone 50 MG tablet Commonly known as:  DESYREL Take 1 tablet (50 mg total) by mouth at bedtime as needed and may repeat dose one time if needed for sleep.  Indication:  Trouble Sleeping      Follow-up [facility] [employer],  [facility] Follow up on 06/07/2018.   Why:  Hospital follow-up on Tuesday, 9/17 at 8:30AM. Please bring: photo ID, insurance card if you have  it, social security card, and hospital discharge paperwork. Thank you.  Contact information: [address] [place] [address] [phone]           Follow-up recommendations:  Continue activity as tolerated. Continue diet as recommended by your PCP. Ensure to keep all appointments with outpatient providers.  Comments:  Patient is instructed prior to discharge to: Take all medications as prescribed by his/her mental healthcare provider. Report any adverse effects and or reactions from the medicines to his/her outpatient provider promptly. Patient has been instructed & cautioned: To not engage in alcohol and or illegal drug use while on prescription medicines. In the event of worsening symptoms, patient is instructed to call the crisis hotline, 911 and or go to the nearest ED for appropriate evaluation and treatment of symptoms. To follow-up with his/her primary care provider for your other medical issues, concerns and or health care needs.    Signed: Gerlene Burdock Money, FNP 06/06/2018, 9:46 AM   Patient seen, Suicide Assessment Completed.  Disposition Plan Reviewed

## 2018-06-06 NOTE — Progress Notes (Signed)
Patient did attend the evening speaker AA meeting.  

## 2018-06-06 NOTE — Progress Notes (Signed)
Recreation Therapy Notes  Date: 9.16.19 Time: 0930 Location: 300 Hall Dayroom  Group Topic: Stress Management  Goal Area(s) Addresses:  Patient will verbalize importance of using healthy stress management.  Patient will identify positive emotions associated with healthy stress management.   Intervention: Stress Management  Activity : Guided Imagery.  LRT introduced the stress management technique of guided imagery.  LRT read a script for a forest meditation.  Patients were to follow along as the script was read to engage in the activity.  Education:  Stress Management, Discharge Planning.   Education Outcome: Acknowledges edcuation/In group clarification offered/Needs additional education  Clinical Observations/Feedback: Pt did not attend group.    Chancy Claros, LRT/CTRS         Korion Cuevas A 06/06/2018 12:14 PM 

## 2018-06-06 NOTE — Progress Notes (Signed)
D: Pt A & O X 3. Denies SI, HI, AVH and pain at this time. Presents disheveled with body odor, thought blocking / delayed response with flat affect, depressed mood but is pleasant on interactions. Rates his anxiety 7/10, depression 2/10 and hopelessness 0/10.  D/C home as ordered. Bus pass [place]2 ([employer] given at time of d/c. A: D/C instructions reviewed with pt including prescriptions, medication samples and follow up appointment; compliance encouraged. All belongings from locker # 14 given to pt at time of departure. Scheduled and PRN medications given with verbal education and effects monitored. Safety checks maintained without incident till time of d/c.  R: Pt receptive to care. Compliant with medications when offered. Denies adverse drug reactions when assessed. Verbalized understanding related to d/c instructions. Signed belonging sheet in agreement with items received from locker. Ambulatory with a steady gait. Appears to be in no physical distress at time of departure.

## 2018-06-06 NOTE — Tx Team (Addendum)
Interdisciplinary Treatment and Diagnostic Plan Update  06/06/2018 Time of Session: 1610RU Fernando Wilkinson MRN: [id number]  Principal Diagnosis: Severe recurrent major depression without psychotic features [facility])  Secondary Diagnoses: Principal Problem:   Severe recurrent major depression without psychotic features (HCC) Active Problems:   Alcohol use disorder, severe, dependence (HCC)   Current Medications:  Current Facility-Administered Medications  Medication Dose Route Frequency Provider Last Rate Last Dose  . acetaminophen (TYLENOL) tablet 650 mg  650 mg Oral Q6H PRN [name], NP      . alum & mag hydroxide-simeth (MAALOX/MYLANTA) 200-200-20 MG/5ML suspension 30 mL  30 mL Oral Q4H PRN [name], NP      . ARIPiprazole (ABILIFY) tablet 5 mg  5 mg Oral Daily [name], NP   5 mg at 06/06/18 0823  . FLUoxetine (PROZAC) capsule 20 mg  20 mg Oral Daily [name], NP   20 mg at 06/06/18 0825  . gemfibrozil (LOPID) tablet 600 mg  600 mg Oral BID [name], NP   600 mg at 06/06/18 0823  . hydrOXYzine (ATARAX/VISTARIL) tablet 50 mg  50 mg Oral Q6H PRN [name], MD   50 mg at 06/05/18 2159  . losartan (COZAAR) tablet 100 mg  100 mg Oral Daily [name], NP   100 mg at 06/06/18 0823  . magnesium hydroxide (MILK OF MAGNESIA) suspension 30 mL  30 mL Oral Daily PRN [name], NP      . metFORMIN (GLUCOPHAGE-XR) 24 hr tablet 500 mg  500 mg Oral Daily [name], NP   500 mg at 06/06/18 0825  . multivitamin with minerals tablet 1 tablet  1 tablet Oral Daily [name], NP   1 tablet at 06/06/18 0825  . nicotine (NICODERM CQ - dosed in mg/24 hours) patch 21 mg  21 mg Transdermal Daily Cobos, [name], MD   21 mg at 06/06/18 0825  . thiamine (VITAMIN B-1) tablet 100 mg  100 mg Oral Daily [name], NP   100 mg at 06/06/18 0823  . traZODone (DESYREL) tablet 50 mg  50 mg Oral QHS PRN,MR X 1 [name], MD   50 mg at 06/05/18  2159   PTA Medications: Medications Prior to Admission  Medication Sig Dispense Refill Last Dose  . acetaminophen (TYLENOL) 500 MG tablet Take 1,000 mg by mouth every 6 (six) hours as needed for headache.   05/29/2018  . gemfibrozil (LOPID) 600 MG tablet Take 600 mg by mouth 2 (two) times daily.   05/30/2018  . losartan (COZAAR) 100 MG tablet Take 100 mg by mouth daily.   05/30/2018  . metFORMIN (GLUCOPHAGE-XR) 500 MG 24 hr tablet Take 500 mg by mouth daily.   05/30/2018    Patient Stressors: Health problems Medication change or noncompliance Occupational concerns Substance abuse  Patient Strengths: Ability for insight Average or above average intelligence Capable of independent living General fund of knowledge  Treatment Modalities: Medication Management, Group therapy, Case management,  1 to 1 session with clinician, Psychoeducation, Recreational therapy.   Physician Treatment Plan for Primary Diagnosis: Severe recurrent major depression without psychotic features (HCC) Long Term Goal(s): Improvement in symptoms so as ready for discharge Improvement in symptoms so as ready for discharge   Short Term Goals: Ability to identify changes in lifestyle to reduce recurrence of condition will improve Ability to disclose and discuss suicidal ideas Ability to demonstrate self-control will improve Ability to identify and  develop effective coping behaviors will improve Compliance with prescribed medications will improve Ability to identify triggers associated with substance abuse/mental health issues will improve  Medication Management: Evaluate patient's response, side effects, and tolerance of medication regimen.  Therapeutic Interventions: 1 to 1 sessions, Unit Group sessions and Medication administration.  Evaluation of Outcomes: Adequate for discharge  Physician Treatment Plan for Secondary Diagnosis: Principal Problem:   Severe recurrent major depression without psychotic features  (HCC) Active Problems:   Alcohol use disorder, severe, dependence (HCC)  Long Term Goal(s): Improvement in symptoms so as ready for discharge Improvement in symptoms so as ready for discharge   Short Term Goals: Ability to identify changes in lifestyle to reduce recurrence of condition will improve Ability to disclose and discuss suicidal ideas Ability to demonstrate self-control will improve Ability to identify and develop effective coping behaviors will improve Compliance with prescribed medications will improve Ability to identify triggers associated with substance abuse/mental health issues will improve     Medication Management: Evaluate patient's response, side effects, and tolerance of medication regimen.  Therapeutic Interventions: 1 to 1 sessions, Unit Group sessions and Medication administration.  Evaluation of Outcomes: Adequate for discharge   RN Treatment Plan for Primary Diagnosis: Severe recurrent major depression without psychotic features (HCC) Long Term Goal(s): Knowledge of disease and therapeutic regimen to maintain health will improve  Short Term Goals: Ability to remain free from injury will improve, Ability to demonstrate self-control, Ability to disclose and discuss suicidal ideas and Ability to identify and develop effective coping behaviors will improve  Medication Management: RN will administer medications as ordered by provider, will assess and evaluate patient's response and provide education to patient for prescribed medication. RN will report any adverse and/or side effects to prescribing provider.  Therapeutic Interventions: 1 on 1 counseling sessions, Psychoeducation, Medication administration, Evaluate responses to treatment, Monitor vital signs and CBGs as ordered, Perform/monitor CIWA, COWS, AIMS and Fall Risk screenings as ordered, Perform wound care treatments as ordered.  Evaluation of Outcomes: Adequate for discharge   LCSW Treatment Plan for  Primary Diagnosis: Severe recurrent major depression without psychotic features (HCC) Long Term Goal(s): Safe transition to appropriate next level of care at discharge, Engage patient in therapeutic group addressing interpersonal concerns.  Short Term Goals: Engage patient in aftercare planning with referrals and resources, Facilitate patient progression through stages of change regarding substance use diagnoses and concerns and Identify triggers associated with mental health/substance abuse issues  Therapeutic Interventions: Assess for all discharge needs, 1 to 1 time with Social worker, Explore available resources and support systems, Assess for adequacy in community support network, Educate family and significant other(s) on suicide prevention, Complete Psychosocial Assessment, Interpersonal group therapy.  Evaluation of Outcomes: Adequate for discharge   Progress in Treatment: Attending groups: Yes. Participating in groups: Yes. Taking medication as prescribed: Yes. Toleration medication: Yes. Family/Significant other contact made: SPE completed with pt; his only identified support (girlfriend) is currently in jail.  Patient understands diagnosis: Yes. Discussing patient identified problems/goals with staff: Yes. Medical problems stabilized or resolved: Yes. Denies suicidal/homicidal ideation: Yes. Issues/concerns per patient self-inventory: No. Other: n/a   New problem(s) identified: n/a  New Short Term/Long Term Goal(s): detox, medication management for mood stabilization; elimination of SI thoughts; development of comprehensive mental wellness/sobriety plan.   Patient Goals:  "I need to learn ways to deal with my depression and get help with alcoholism."   Discharge Plan or Barriers: Pt plans to return home at discharge and [facility] [place] [facility] been scheduled. MHAG pamphlet, Mobile Crisis information, and AA/NA information provided to patient for  additional community support and resources.   Reason for Continuation of Hospitalization: none  Estimated Length of Stay: Monday, 06/06/18  Attendees: Patient: 06/06/2018 8:47 AM  Physician: Dr. Jola Babinski MD; Dr. Altamese [place] MD 06/06/2018 8:47 AM  Nursing: Meriam Sprague RN; [facility] RN 06/06/2018 8:47 AM  RN Care Manager:x 06/06/2018 8:47 AM  Social Worker: Corrie Mckusick LCSW 06/06/2018 8:47 AM  Recreational Therapist: x 06/06/2018 8:47 AM  Other: Reola Calkins NP; Hillery Jacks NP 06/06/2018 8:47 AM  Other:  06/06/2018 8:47 AM  Other: 06/06/2018 8:47 AM    Scribe for Treatment Team: Rona Ravens, LCSW 06/06/2018 8:47 AM

## 2018-06-06 NOTE — Progress Notes (Signed)
  [facility] Discharge Plan :  Will you be returning to the same living situation after discharge:  Yes,  home At discharge, do you have transportation home?: Yes,  pt working on getting transport home Do you have the ability to pay for your medications: Yes,  mental health  Release of information consent forms completed and submitted to medical records by CSW.   Patient to Follow up at: Follow-up Information    [facility] Follow up on 06/07/2018.   Why:  Hospital follow-up on Tuesday, 9/17 at 12:15PM. Please bring the following if you have it: Photo ID, social security card, any proof of household income, and hospital discharge paperwork. Thank you.  Contact information: [address] [place] [id number] [phone]           Next level of care provider has access to [facility] Link:no  Safety Planning and Suicide Prevention discussed: Yes,  SPE completed with pt; pt declined to consent to collateral contact.   Have you used any form of tobacco in the last 30 days? (Cigarettes, Smokeless Tobacco, Cigars, and/or Pipes): Yes  Has patient been referred to the Quitline?: Patient refused referral  Patient has been referred for addiction treatment: Yes  Rona RavensHeather S Ladarrius Bogdanski, LCSW 06/06/2018, 10:40 AM
